# Patient Record
Sex: Male | Born: 1975 | Hispanic: Yes | Marital: Married | Smoking: Never smoker
Health system: Southern US, Community
[De-identification: ages and names within clinical notes are randomized; demographics above are authoritative.]

## PROBLEM LIST (undated history)

## (undated) DIAGNOSIS — Z9889 Other specified postprocedural states: Secondary | ICD-10-CM

## (undated) DIAGNOSIS — R55 Syncope and collapse: Secondary | ICD-10-CM

## (undated) DIAGNOSIS — E119 Type 2 diabetes mellitus without complications: Secondary | ICD-10-CM

## (undated) DIAGNOSIS — R112 Nausea with vomiting, unspecified: Secondary | ICD-10-CM

## (undated) DIAGNOSIS — T4145XA Adverse effect of unspecified anesthetic, initial encounter: Secondary | ICD-10-CM

## (undated) DIAGNOSIS — T8859XA Other complications of anesthesia, initial encounter: Secondary | ICD-10-CM

## (undated) HISTORY — PX: OTHER SURGICAL HISTORY: SHX169

---

## 2013-03-21 ENCOUNTER — Ambulatory Visit: Payer: No Typology Code available for payment source | Attending: Family Medicine | Admitting: Family Medicine

## 2013-03-21 ENCOUNTER — Telehealth: Payer: Self-pay

## 2013-03-21 VITALS — BP 118/69 | HR 53 | Temp 98.0°F | Resp 17 | Wt 226.0 lb

## 2013-03-21 DIAGNOSIS — L659 Nonscarring hair loss, unspecified: Secondary | ICD-10-CM | POA: Insufficient documentation

## 2013-03-21 DIAGNOSIS — L738 Other specified follicular disorders: Secondary | ICD-10-CM

## 2013-03-21 DIAGNOSIS — K029 Dental caries, unspecified: Secondary | ICD-10-CM

## 2013-03-21 DIAGNOSIS — Z0000 Encounter for general adult medical examination without abnormal findings: Secondary | ICD-10-CM | POA: Insufficient documentation

## 2013-03-21 DIAGNOSIS — L739 Follicular disorder, unspecified: Secondary | ICD-10-CM

## 2013-03-21 LAB — COMPLETE METABOLIC PANEL WITH GFR
Alkaline Phosphatase: 101 U/L (ref 39–117)
BUN: 21 mg/dL (ref 6–23)
GFR, Est Non African American: >89 mL/min
Glucose, Bld: 151 mg/dL — ABNORMAL HIGH (ref 70–99)
Sodium: 142 mEq/L (ref 135–145)
Total Bilirubin: 1 mg/dL (ref 0.3–1.2)
Total Protein: 7.2 g/dL (ref 6.0–8.3)

## 2013-03-21 LAB — CBC
Hemoglobin: 14.8 g/dL (ref 13.0–17.0)
MCH: 32 pg (ref 26.0–34.0)
MCHC: 34.4 g/dL (ref 30.0–36.0)

## 2013-03-21 LAB — LIPID PANEL
Cholesterol: 185 mg/dL (ref 0–200)
HDL: 45 mg/dL (ref >39)
Triglycerides: 233 mg/dL — ABNORMAL HIGH (ref <150)

## 2013-03-21 NOTE — Progress Notes (Signed)
Patient ID: Derek Guzman, male   DOB: April 09, 1976, 37 y.o.   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  He reports that he essentially has been healthy.  He's not currently taking any medications.  He reports that he does not smoke cigarettes or use recreational drugs.  He does drink alcohol on the weekends but does so in moderation.  The patient reports that he has otherwise been very well. No complaints at this time.  No Known Allergies History reviewed. No pertinent past medical history. No current outpatient prescriptions on file prior to visit.   No current facility-administered medications on file prior to visit.   History reviewed. No pertinent family history. History   Social History  . Marital Status: Married    Spouse Name: N/A    Number of Children: 2  . Years of Education: 11   Occupational History  . concrete     Social History Main Topics  . Smoking status: Never Smoker   . Smokeless tobacco: Not on file  . Alcohol Use: Yes  . Drug Use: No  . Sexually Active: Not on file   Other Topics Concern  . Not on file   Social History Narrative  . No narrative on file    Review of Systems  Constitutional: Negative for fever, chills, diaphoresis, activity change, appetite change and fatigue.  HENT: Negative for ear pain, nosebleeds, congestion, facial swelling, rhinorrhea, neck pain, neck stiffness and ear discharge.   Eyes: Negative for pain, discharge, redness, itching and visual disturbance.  Respiratory: Negative for cough, choking, chest tightness, shortness of breath, wheezing and stridor.   Cardiovascular: Negative for chest pain, palpitations and leg swelling.  Gastrointestinal: Negative for abdominal distention.  Genitourinary: Negative for dysuria, urgency, frequency,  hematuria, flank pain, decreased urine volume, difficulty urinating and dyspareunia.  Musculoskeletal: Negative for back pain, joint swelling, arthralgias and gait problem.  Neurological: Negative for dizziness, tremors, seizures, syncope, facial asymmetry, speech difficulty, weakness, light-headedness, numbness and headaches.  Hematological: Negative for adenopathy. Does not bruise/bleed easily.  Psychiatric/Behavioral: Negative for hallucinations, behavioral problems, confusion, dysphoric mood, decreased concentration and agitation.    Objective:   Filed Vitals:   03/21/13 1046  BP: 118/69  Pulse: 53  Temp: 98 F (36.7 C)  Resp: 17    Physical Exam  Constitutional: Appears well-developed and well-nourished. No distress.  HENT: Normocephalic. External right and left ear normal. Oropharynx is clear and moist. Dental caries seen. Folliculitis seen on back of scalp area with some alopecia seen.   Eyes: Conjunctivae and EOM are normal. PERRLA, no scleral icterus.  Neck: Normal ROM. Neck supple. No JVD. No tracheal deviation. No thyromegaly.  CVS: RRR, S1/S2 +, no murmurs, no gallops, no carotid bruit.  Pulmonary: Effort and breath sounds normal, no stridor, rhonchi, wheezes, rales.  Abdominal: Soft. BS +,  no distension, tenderness, rebound or guarding.  GU: No abnormality seen uncircumcised male, prostate exam within normal limits no masses palpated and no enlargement noted. Musculoskeletal: Normal range of motion. No edema and no tenderness.  Lymphadenopathy: No lymphadenopathy noted, cervical, inguinal. Neuro: Alert. Normal reflexes, muscle tone coordination. No cranial nerve deficit. Skin: Skin is warm and dry. No rash noted. Not diaphoretic. No erythema. No pallor.  Psychiatric: Normal mood and affect. Behavior, judgment, thought content normal.   No results found  for this basename: WBC, HGB, HCT, MCV, PLT   No results found for this basename: CREATININE, BUN, NA, K, CL, CO2     No results found for this basename: HGBA1C   Lipid Panel  No results found for this basename: chol, trig, hdl, cholhdl, vldl, ldlcalc    Assessment and plan:   Patient Active Problem List   Diagnosis Date Noted  . Routine general medical examination at a health care facility 03/21/2013  . Folliculitis 03/21/2013  . Dental caries 03/21/2013  . Health maintenance examination 03/21/2013   Healthy 37 year old male.  Will order routine labs today including metabolic panel and CBC with lipid panel.  Will refer patient to a dentist for further evaluation and treatment. Derm referral for treatment of folliculitis condition on back of scalp with alopecia.    Follow up lab results.    RTC in 6 months.   Rodney Langton, MD, CDE, FAAFP [facility] [place], [place]

## 2013-03-21 NOTE — Progress Notes (Signed)
Used the interpreter line Patient here for physical Takes no medications and has no allergies

## 2013-03-21 NOTE — Telephone Encounter (Signed)
Tried to call patient to inquire about Tdap vaccine(if he ever got one) Patient not available Left message to return our call

## 2013-03-21 NOTE — Patient Instructions (Addendum)
Foliculitis  (Folliculitis)  La foliculitis es el enrojecimiento, dolor e hinchazn (inflamacin) de los folculos pilosos. Puede ocurrir en cualquier parte del cuerpo. Las personas con un sistema inmunolgico debilitado, con diabetes u obesidad tienen mayor riesgo de sufrir foliculitis.  CAUSAS   Infecciones bacterianas. sta es la causa ms frecuente.  Infecciones por hongos.  Infecciones virales.  Contacto con ciertas sustancias qumicas, especialmente aceites y alquitrn. La foliculitis crnica puede ser el resultado de las bacterias que viven en las fosas nasales. Las bacterias pueden favorecer los brotes de foliculitis con el [place].  SNTOMAS  La foliculitis ocurre con mayor frecuencia en el cuero cabelludo, los muslos, las piernas, la [place], las nalgas y las reas donde el pelo se afeita con frecuencia. Una primera seal de foliculitis es una , lesin pequea llena de pus, de color blanco o amarillo, y que pica (pstula).Mickeal Skinner lesiones aparecen en un folculo inflamado y rojo. Por lo general miden menos de 0.2 pulgadas (5 mm) de ancho. Cuando hay una infeccin del folculo que se hace ms profundo, se convierte en un fornculo. Un grupo de varios fornculos juntos forma una lesin mayor (carbunclo). El carbunclo ocurre en reas pilosas y sudorosas del cuerpo.  DIAGNSTICO  El mdico har el diagnstico con un examen fsico. Podr tomarle Lauris Poag de una de las lesiones y Financial risk analyst en un labloratorio. As se determinar la causa de la foliculitis.  TRATAMIENTO  El tratamiento podr incluir:   La aplicacin de compresas calientes en la zona afectada.  Tomar antibiticos por va oral o aplicarlos sobre la piel.  Drenaje de las lesiones si contienen una gran cantidad de pus o lquido.  Depilacin lser para casos de foliculitis de larga duracin. Esto ayuda a Armed forces training and education officer del pelo. INSTRUCCIONES PARA EL CUIDADO EN EL HOGAR   Aplique compresas calientes en la  zona afectada segn lo indique su mdico.  Si le han recetado medicamentos, tmelos segn las indicaciones. Tmelos todos, aunque se sienta mejor.  Tome medicamentos de venta libre para Associate Professor.  No rasure la piel irritada.  Concurra a las visitas de control con el mdico, segn las indicaciones. SOLICITE ATENCIN MDICA DE INMEDIATO SI:   Observa un aumento del enrojecimiento, hinchazn o dolor en la zona afectada.  Tiene fiebre. ASEGRESE DE QUE:   Comprende estas instrucciones.  Controlar su enfermedad.  Solicitar ayuda de inmediato si no mejora o si empeora. Document Released: 08/29/2005 Document Revised: 02/28/2012 [facility] Patient Information 2014 [place], [place]. Caries Dental (Dental Caries) Las caries dentales son zonas de destruccin del diente. La causa generalmente es una combinacin de higiene dental deficiente. El azcar, el tabaco y el consumo de drogas disminuyen la produccin de saliva y retraen las encas. Si las caries no son tratadas por un dentista, pronto aumentan su tamao. Esto causa dolor, infecciones y prdida del diente. Las caries en el esmalte externo no causan sntomas. El dolor que se siente al beber lquidos fros puede ser Financial risk analyst sntoma de que el esmalte se ha roto y la caries ha avanzado hacia la raz del diente. Esto puede hacer que el diente se pierda o se infecte. Si la caries se trata antes de que cause dolor, el diente generalmente se salva. Las caries pueden prevenirse con una buena higiene dental. El lavado matutino y nocturno y el uso de hilo dental una vez por da ayuda a eliminar la placa y a reducir las bacterias. Los caramelos, las gaseosas y otras fuentes de International aid/development worker  facilitan las caries dentales favoreciendo el desarrollo de bacterias en la boca. Una dieta adecuada, la ingesta de fluor, la higiene dental y el relleno de la cavidad son medidas importantes para prevenir la prdida del diente. Si la caries es de [place], ser  necesaria la administracin de antibiticos, un tratamiento de conducto o Programmer, systems. Tome los antibiticos prescriptos y todos los medicamentos segn se lo indique el mdico. Es importante que concurra a las visitas de control para una curacin definitiva. SOLICITE ATENCIN MDICA SI:  Usted o su nio tienen una temperatura oral de ms de 102 F (38.9 C).  Tiene dificultad para abrir Government social research officer.  Tiene dificultad para tragar o eliminar secreciones.  Tiene problemas para respirar.  Siente dolor en el pecho.  Tiene sntomas que empeoran o lo preocupan. Document Released: 08/29/2005 Document Revised: 11/21/2011 [facility] Patient Information 2014 [place], [place]. Cuidado de los dientes y visitas al Emergency planning/management officer  (Dental Care and Dentist Visits) El cuidado dental favorece la buena salud en general. Las visitas regulares al odontlogo evitarn el dolor dental, sangrado, infecciones y otros problemas de salud ms graves en el futuro. Es Primary school teacher la boca sana, porque las enfermedades de los dientes, las encas y otros tejidos de la boca pueden extenderse a otras reas del cuerpo. Algunas enfermedades, como la diabetes, enfermedades cardacas y Government social research officer se han relacionado con una mala salud oral.  Visite a su odontlogo cada seis meses. Si tiene Radio broadcast assistant, como dolor de [place] o rotura de dientes consulte inmediatamente. Si concurre regularmente al [employer], podr [employer] antes de [place]. Es ms fcil realizar un tratamiento en las primeras etapas.  QU ESPERAR EN UNA VISITA AL DENTISTA  El odontlogo har un diagnstico de los problemas de salud de la boca e indicar el tratamiento [place]. En su visita habitual al odontlogo podr recibir:   Neomia Dear limpieza suave de los dientes y las encas. Incluye raspado y pulido. Esta prctica eliminar la sustancia pegajosa que cubre los dientes y las encas (placa). La placa se forma en la boca despus de comer. Con  el tiempo, la placa se endurece en los dientes y se forma sarro. Si el sarro no se elimina regularmente, puede causar problemas. La limpieza tambin ayuda a [employer].  Radiografas peridicas. Estas imgenes de los dientes y del [employer] sostiene le permitirn al dentista evaluar la salud de sus dientes.  Tratamientos peridicos con flor. El flor es un mineral natural que se ha comprobado que fortalece los dientes. El tratamiento con flor consiste en la aplicacin de un gel o barniz de flor [employer]. Se realiza con ms frecuencia en los nios.  Examen de la boca, [place], [place], los dientes y las encas para ver si hay problemas de salud bucal como:  Caries (caries dentales). Es la erosin del diente debido a la placa, Insurance claims handler y el cido en la boca. Lo mejor es tratar la caries cuando es pequea.  Inflamacin de las encas causada por la acumulacin de placa (gingivitis).  Problemas con la boca o dientes mal formados o mal alineados.  Cncer bucal u otras enfermedades de los tejidos blandos o de las [place].  MANTENGA LA SALUD DE SUS DIENTES Y ENCAS  Para tener dientes y encas sanos siga estas pautas generales, as como asesoramiento especfico de su dentista:   Hgase una limpieza dental con el dentista cada seis meses.  Cepllese dos veces al da con una crema dental con  flor.  Pase hilo dental todos los das.  Pregntele al dentista si necesita suplementos, tratamientos o dentfrico con flor.  Consuma una dieta saludable. Reduzca el consumo de alimentos y bebidas con azcar agregada.  Evite fumar. TRATAMIENTO PARA LOS PROBLEMAS DE SALUD ORALES  Si tiene problemas de [employer] boca, el tratamiento variar segn el estado actual de los dientes y las encas.   Su mdico le recomendar una buena higiene oral en cada visita.  Para las caries, la gingivitis u otras enfermedades de la boca, el mdico llevar a cabo un procedimiento para  tratar el problema. Esto se realiza generalmente en otra visita programada. En algunos casos lo derivar a Probation officer para tratar problemas especficos de los dientes o para realizar [place]. SOLICITE ATENCIN ODONTOLGICA DE INMEDIATO SI:   Siente dolor, tiene sangrado o sensibilidad en las encas, los dientes, la mandbula o la zona de la boca.  Un diente permanente se afloja o se separa de la cavidad de la enca.  Ha sufrido un golpe o una lesin en la boca o en la zona de la [place]. Document Released: 05/23/2012 [facility] Patient Information 2014 [place], [place].

## 2013-03-22 ENCOUNTER — Telehealth: Payer: Self-pay

## 2013-03-22 LAB — VITAMIN D 25 HYDROXY (VIT D DEFICIENCY, FRACTURES): Vit D, 25-Hydroxy: 26 ng/mL — ABNORMAL LOW (ref 30–89)

## 2013-03-22 NOTE — Telephone Encounter (Signed)
Message copied by Lestine Mount on Fri Mar 22, 2013 10:57 AM ------      Message from: Cleora Fleet      Created: Fri Mar 22, 2013  8:55 AM       Interpreter needed to communicate with patient: Please inform patient that his labs came back okay except his blood sugar was elevated.  I would like for him to come back in next week to get an A1c test done in the office.  This is to make sure he does not have diabetes or prediabetes.  His vitamin D level came back a little low.  Recommend he take over-the-counter vitamin D 1000 international units caps take 1 by mouth daily.  His other labs came back okay.                    Rodney Langton, MD, CDE, FAAFP      [facility]      [facility]      [place], [place]        ------

## 2013-03-22 NOTE — Progress Notes (Signed)
Quick Note:  Interpreter needed to communicate with patient: Please inform patient that his labs came back okay except his blood sugar was elevated. I would like for him to come back in next week to get an A1c test done in the office. This is to make sure he does not have diabetes or prediabetes. His vitamin D level came back a little low. Recommend he take over-the-counter vitamin D 1000 international units caps take 1 by mouth daily. His other labs came back okay.    Derek Langton, MD, CDE, FAAFP [facility] [place], [place]   ______

## 2013-03-22 NOTE — Telephone Encounter (Signed)
Left message on machine to return our call. 

## 2013-03-27 ENCOUNTER — Telehealth: Payer: Self-pay | Admitting: Family Medicine

## 2013-03-27 NOTE — Telephone Encounter (Signed)
Clinical: Pt's wife calling for bloodwork results, pt is at work.  Pt and wife speak spanish.   Arna Medici: wife calling about dermatology and dental referral.

## 2013-03-28 NOTE — Telephone Encounter (Signed)
I been calling the patient number and is a  wrong number. i mailed a letter to pt to contact me on 03/21/13 .

## 2013-04-04 ENCOUNTER — Telehealth: Payer: Self-pay | Admitting: *Deleted

## 2013-04-04 NOTE — Telephone Encounter (Signed)
04/04/13 Spoke with patient and made aware of lab doctor recommend to start taking Vitamin D 1000 iu over the counter Also appointment made for 04/11/13 f/u with Diabetes and A1c. Sherlene Shams BSN MHA

## 2013-04-11 ENCOUNTER — Ambulatory Visit: Payer: No Typology Code available for payment source | Admitting: Family Medicine

## 2014-01-10 ENCOUNTER — Ambulatory Visit: Payer: No Typology Code available for payment source | Attending: Internal Medicine | Admitting: Family Medicine

## 2014-01-10 ENCOUNTER — Encounter: Payer: Self-pay | Admitting: Family Medicine

## 2014-01-10 VITALS — BP 130/77 | HR 63 | Temp 98.5°F | Resp 17 | Wt 226.8 lb

## 2014-01-10 DIAGNOSIS — R7989 Other specified abnormal findings of blood chemistry: Secondary | ICD-10-CM

## 2014-01-10 DIAGNOSIS — L989 Disorder of the skin and subcutaneous tissue, unspecified: Secondary | ICD-10-CM | POA: Insufficient documentation

## 2014-01-10 DIAGNOSIS — R011 Cardiac murmur, unspecified: Secondary | ICD-10-CM | POA: Insufficient documentation

## 2014-01-10 DIAGNOSIS — Z0000 Encounter for general adult medical examination without abnormal findings: Secondary | ICD-10-CM

## 2014-01-10 DIAGNOSIS — B369 Superficial mycosis, unspecified: Secondary | ICD-10-CM | POA: Insufficient documentation

## 2014-01-10 LAB — GLUCOSE, POCT (MANUAL RESULT ENTRY): POC GLUCOSE: 148 mg/dL — AB (ref 70–99)

## 2014-01-10 LAB — POCT GLYCOSYLATED HEMOGLOBIN (HGB A1C): HEMOGLOBIN A1C: 5.8

## 2014-01-10 MED ORDER — ERYTHROMYCIN 2 % EX OINT: TOPICAL_OINTMENT | CUTANEOUS | Status: DC

## 2014-01-10 MED ORDER — PENICILLIN V POTASSIUM 500 MG PO TABS: 500.0000 mg | ORAL_TABLET | Freq: Four times a day (QID) | ORAL | Status: DC

## 2014-01-10 NOTE — Patient Instructions (Addendum)
Use ointment for neck twice a day It will take about two months to take effect Follow a low carb low fat diet Return in two years for physical

## 2014-01-10 NOTE — Progress Notes (Signed)
   Subjective:    Patient ID: Derek Guzman, male    DOB: 06/13/1976, 38 y.o.   MRN: [id number]  HPI patient is here with interpreter States he is here for a physical  Takes no prescribed medications Has a nephew that has a heart murmur  Has no extensive background history of illness in his family Generally a healthy individual  History of elevated blood sugar 150 on recent labs  Patient reports no  vision/ hearing changes,anorexia, weight change, fever ,adenopathy, persistant / recurrent hoarseness, swallowing issues, chest pain, edema,persistant / recurrent cough, hemoptysis, dyspnea(rest, exertional, paroxysmal nocturnal), gastrointestinal  bleeding (melena, rectal bleeding), abdominal pain, excessive heart burn, GU symptoms(dysuria, hematuria, pyuria, voiding/incontinence  Issues) syncope, focal weakness, severe memory loss, concerning skin lesions, depression, anxiety, abnormal bruising/bleeding, major joint swelling.     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  Otoscopic examination reveals clear canals, tympanic membranes are intact bilaterally without bulging, retraction, inflammation or discharge. Hearing is grossly normal bilaterall Eye - Pupils Equal Round Reactive to light, Extraocular movements intact, Fundi without hemorrhage or visible lesions, Conjunctiva without redness or discharge Lungs:  Normal respiratory effort, chest expands symmetrically. Lungs are clear to auscultation, no crackles or wheezes. Heart - Regular rate and rhythm.  No murmurs, gallops or rubs.    No visible rash Abdomen: soft and non-tender without masses, organomegaly or hernias noted.  No guarding or rebound Extremities:  No cyanosis, edema, or deformity noted with good range of motion of all major joints.           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

## 2014-03-18 ENCOUNTER — Emergency Department (HOSPITAL_COMMUNITY): Payer: Worker's Compensation

## 2014-03-18 ENCOUNTER — Inpatient Hospital Stay (HOSPITAL_COMMUNITY)
Admission: EM | Admit: 2014-03-18 | Discharge: 2014-03-22 | DRG: 464 | Disposition: A | Discharge status: Home Health | Payer: Worker's Compensation | Attending: Orthopedic Surgery | Admitting: Orthopedic Surgery

## 2014-03-18 ENCOUNTER — Encounter (HOSPITAL_COMMUNITY): Payer: Self-pay | Admitting: Emergency Medicine

## 2014-03-18 ENCOUNTER — Encounter (HOSPITAL_COMMUNITY)
Admission: EM | Disposition: A | Discharge status: Home Health | Payer: Self-pay | Source: Home / Self Care | Attending: Orthopedic Surgery

## 2014-03-18 ENCOUNTER — Encounter (HOSPITAL_COMMUNITY): Payer: Worker's Compensation | Admitting: Critical Care Medicine

## 2014-03-18 ENCOUNTER — Emergency Department (HOSPITAL_COMMUNITY): Payer: Worker's Compensation | Admitting: Critical Care Medicine

## 2014-03-18 DIAGNOSIS — W3183XA Contact with special construction vehicle in stationary use, initial encounter: Secondary | ICD-10-CM

## 2014-03-18 DIAGNOSIS — S82843A Displaced bimalleolar fracture of unspecified lower leg, initial encounter for closed fracture: Principal | ICD-10-CM | POA: Diagnosis present

## 2014-03-18 DIAGNOSIS — S82409A Unspecified fracture of shaft of unspecified fibula, initial encounter for closed fracture: Secondary | ICD-10-CM | POA: Diagnosis present

## 2014-03-18 DIAGNOSIS — S9700XA Crushing injury of unspecified ankle, initial encounter: Secondary | ICD-10-CM | POA: Diagnosis present

## 2014-03-18 DIAGNOSIS — Z6832 Body mass index (BMI) 32.0-32.9, adult: Secondary | ICD-10-CM

## 2014-03-18 DIAGNOSIS — T79A29A Traumatic compartment syndrome of unspecified lower extremity, initial encounter: Secondary | ICD-10-CM | POA: Diagnosis present

## 2014-03-18 DIAGNOSIS — T79A21A Traumatic compartment syndrome of right lower extremity, initial encounter: Secondary | ICD-10-CM

## 2014-03-18 DIAGNOSIS — S82891A Other fracture of right lower leg, initial encounter for closed fracture: Secondary | ICD-10-CM

## 2014-03-18 HISTORY — PX: FASCIOTOMY: SHX132

## 2014-03-18 HISTORY — PX: ORIF ANKLE FRACTURE: SHX5408

## 2014-03-18 HISTORY — PX: APPLICATION OF WOUND VAC: SHX5189

## 2014-03-18 LAB — ABO/RH: ABO/RH(D): O POS

## 2014-03-18 LAB — TYPE AND SCREEN
ABO/RH(D): O POS
ANTIBODY SCREEN: NEGATIVE

## 2014-03-18 LAB — CBC
HCT: 41.8 % (ref 39.0–52.0)
HEMOGLOBIN: 14.5 g/dL (ref 13.0–17.0)
MCH: 33.3 pg (ref 26.0–34.0)
MCHC: 34.7 g/dL (ref 30.0–36.0)
MCV: 96.1 fL (ref 78.0–100.0)
Platelets: 180 10*3/uL (ref 150–400)
RBC: 4.35 MIL/uL (ref 4.22–5.81)
RDW: 13.1 % (ref 11.5–15.5)
WBC: 14 10*3/uL — ABNORMAL HIGH (ref 4.0–10.5)

## 2014-03-18 LAB — BASIC METABOLIC PANEL
Anion gap: 15 (ref 5–15)
BUN: 19 mg/dL (ref 6–23)
CO2: 24 mEq/L (ref 19–32)
Calcium: 9 mg/dL (ref 8.4–10.5)
Chloride: 105 mEq/L (ref 96–112)
Creatinine, Ser: 1 mg/dL (ref 0.50–1.35)
GFR calc Af Amer: >90 mL/min (ref >90)
GFR calc non Af Amer: >90 mL/min (ref >90)
GLUCOSE: 149 mg/dL — AB (ref 70–99)
POTASSIUM: 4 meq/L (ref 3.7–5.3)
Sodium: 144 mEq/L (ref 137–147)

## 2014-03-18 LAB — GLUCOSE, CAPILLARY: Glucose-Capillary: 190 mg/dL — ABNORMAL HIGH (ref 70–99)

## 2014-03-18 SURGERY — OPEN REDUCTION INTERNAL FIXATION (ORIF) ANKLE FRACTURE
Anesthesia: General | Site: Leg Lower | Laterality: Right

## 2014-03-18 MED ORDER — HYDROMORPHONE HCL PF 1 MG/ML IJ SOLN
0.5000 mg | INTRAMUSCULAR | Status: DC | PRN
  Administered 2014-03-18 – 2014-03-21 (×6): 1 mg via INTRAVENOUS
  Filled 2014-03-18 (×6): qty 1

## 2014-03-18 MED ORDER — 0.9 % SODIUM CHLORIDE (POUR BTL) OPTIME
TOPICAL | Status: DC | PRN
  Administered 2014-03-18 (×2): 1000 mL

## 2014-03-18 MED ORDER — LIDOCAINE HCL (CARDIAC) 20 MG/ML IV SOLN
INTRAVENOUS | Status: DC | PRN
  Administered 2014-03-18: 70 mg via INTRAVENOUS

## 2014-03-18 MED ORDER — SENNOSIDES-DOCUSATE SODIUM 8.6-50 MG PO TABS
1.0000 | ORAL_TABLET | Freq: Every evening | ORAL | Status: DC | PRN
  Administered 2014-03-19: 1 via ORAL
  Filled 2014-03-18: qty 1

## 2014-03-18 MED ORDER — CEFAZOLIN SODIUM 1-5 GM-% IV SOLN
1.0000 g | Freq: Four times a day (QID) | INTRAVENOUS | Status: AC
  Administered 2014-03-18 – 2014-03-19 (×3): 1 g via INTRAVENOUS
  Filled 2014-03-18 (×3): qty 50

## 2014-03-18 MED ORDER — ONDANSETRON HCL 4 MG/2ML IJ SOLN
4.0000 mg | Freq: Four times a day (QID) | INTRAMUSCULAR | Status: DC | PRN
  Administered 2014-03-18 – 2014-03-19 (×2): 4 mg via INTRAVENOUS
  Filled 2014-03-18 (×2): qty 2

## 2014-03-18 MED ORDER — DEXAMETHASONE SODIUM PHOSPHATE 4 MG/ML IJ SOLN
INTRAMUSCULAR | Status: AC
  Filled 2014-03-18: qty 1

## 2014-03-18 MED ORDER — ONDANSETRON HCL 4 MG PO TABS: 4.0000 mg | ORAL_TABLET | Freq: Four times a day (QID) | ORAL | Status: DC | PRN

## 2014-03-18 MED ORDER — MIDAZOLAM HCL 5 MG/5ML IJ SOLN
INTRAMUSCULAR | Status: DC | PRN
  Administered 2014-03-18: 2 mg via INTRAVENOUS

## 2014-03-18 MED ORDER — OXYCODONE-ACETAMINOPHEN 5-325 MG PO TABS
1.0000 | ORAL_TABLET | ORAL | Status: DC | PRN
  Administered 2014-03-19 – 2014-03-22 (×11): 2 via ORAL
  Filled 2014-03-18 (×11): qty 2

## 2014-03-18 MED ORDER — HYDROMORPHONE HCL PF 1 MG/ML IJ SOLN
INTRAMUSCULAR | Status: AC
  Filled 2014-03-18: qty 1

## 2014-03-18 MED ORDER — HYDROMORPHONE HCL PF 1 MG/ML IJ SOLN
1.0000 mg | Freq: Once | INTRAMUSCULAR | Status: AC
  Administered 2014-03-18: 1 mg via INTRAVENOUS

## 2014-03-18 MED ORDER — PHENYLEPHRINE 40 MCG/ML (10ML) SYRINGE FOR IV PUSH (FOR BLOOD PRESSURE SUPPORT)
PREFILLED_SYRINGE | INTRAVENOUS | Status: AC
  Filled 2014-03-18: qty 10

## 2014-03-18 MED ORDER — EPHEDRINE SULFATE 50 MG/ML IJ SOLN
INTRAMUSCULAR | Status: DC | PRN
  Administered 2014-03-18: 20 mg via INTRAVENOUS

## 2014-03-18 MED ORDER — BISACODYL 5 MG PO TBEC: 5.0000 mg | DELAYED_RELEASE_TABLET | Freq: Every day | ORAL | Status: DC | PRN

## 2014-03-18 MED ORDER — FENTANYL CITRATE 0.05 MG/ML IJ SOLN
INTRAMUSCULAR | Status: DC | PRN
Start: 2014-03-18 — End: 2014-03-18
  Administered 2014-03-18: 25 ug via INTRAVENOUS
  Administered 2014-03-18: 100 ug via INTRAVENOUS
  Administered 2014-03-18 (×3): 25 ug via INTRAVENOUS

## 2014-03-18 MED ORDER — ROCURONIUM BROMIDE 50 MG/5ML IV SOLN
INTRAVENOUS | Status: AC
  Filled 2014-03-18: qty 1

## 2014-03-18 MED ORDER — LIDOCAINE HCL (CARDIAC) 20 MG/ML IV SOLN
INTRAVENOUS | Status: AC
  Filled 2014-03-18: qty 5

## 2014-03-18 MED ORDER — PROPOFOL 10 MG/ML IV BOLUS
INTRAVENOUS | Status: AC
  Filled 2014-03-18: qty 20

## 2014-03-18 MED ORDER — ONDANSETRON HCL 4 MG/2ML IJ SOLN
INTRAMUSCULAR | Status: DC | PRN
  Administered 2014-03-18: 4 mg via INTRAVENOUS

## 2014-03-18 MED ORDER — PHENYLEPHRINE HCL 10 MG/ML IJ SOLN
INTRAMUSCULAR | Status: DC | PRN
  Administered 2014-03-18: 120 ug via INTRAVENOUS
  Administered 2014-03-18: 200 ug via INTRAVENOUS

## 2014-03-18 MED ORDER — SUCCINYLCHOLINE CHLORIDE 20 MG/ML IJ SOLN
INTRAMUSCULAR | Status: DC | PRN
  Administered 2014-03-18: 120 mg via INTRAVENOUS

## 2014-03-18 MED ORDER — HYDROMORPHONE HCL PF 1 MG/ML IJ SOLN
INTRAMUSCULAR | Status: AC
  Administered 2014-03-18: 1 mg via INTRAVENOUS
  Filled 2014-03-18: qty 1

## 2014-03-18 MED ORDER — ONDANSETRON HCL 4 MG/2ML IJ SOLN
INTRAMUSCULAR | Status: AC
  Filled 2014-03-18: qty 2

## 2014-03-18 MED ORDER — FENTANYL CITRATE 0.05 MG/ML IJ SOLN
INTRAMUSCULAR | Status: AC
  Filled 2014-03-18: qty 5

## 2014-03-18 MED ORDER — DOCUSATE SODIUM 100 MG PO CAPS
100.0000 mg | ORAL_CAPSULE | Freq: Two times a day (BID) | ORAL | Status: DC
  Administered 2014-03-18 – 2014-03-22 (×8): 100 mg via ORAL
  Filled 2014-03-18 (×7): qty 1

## 2014-03-18 MED ORDER — MIDAZOLAM HCL 2 MG/2ML IJ SOLN
INTRAMUSCULAR | Status: AC
  Filled 2014-03-18: qty 2

## 2014-03-18 MED ORDER — METHOCARBAMOL 1000 MG/10ML IJ SOLN
500.0000 mg | Freq: Four times a day (QID) | INTRAVENOUS | Status: DC | PRN
  Filled 2014-03-18: qty 5

## 2014-03-18 MED ORDER — PROPOFOL 10 MG/ML IV BOLUS
INTRAVENOUS | Status: DC | PRN
  Administered 2014-03-18: 200 mg via INTRAVENOUS

## 2014-03-18 MED ORDER — METOCLOPRAMIDE HCL 5 MG/ML IJ SOLN
5.0000 mg | Freq: Three times a day (TID) | INTRAMUSCULAR | Status: DC | PRN
  Administered 2014-03-19: 10 mg via INTRAVENOUS
  Filled 2014-03-18: qty 2

## 2014-03-18 MED ORDER — METOCLOPRAMIDE HCL 5 MG PO TABS: 5.0000 mg | ORAL_TABLET | Freq: Three times a day (TID) | ORAL | Status: DC | PRN

## 2014-03-18 MED ORDER — MAGNESIUM CITRATE PO SOLN
1.0000 | Freq: Once | ORAL | Status: AC | PRN
  Filled 2014-03-18: qty 296

## 2014-03-18 MED ORDER — DEXAMETHASONE SODIUM PHOSPHATE 4 MG/ML IJ SOLN
INTRAMUSCULAR | Status: DC | PRN
Start: 2014-03-18 — End: 2014-03-18
  Administered 2014-03-18: 4 mg via INTRAVENOUS

## 2014-03-18 MED ORDER — IOHEXOL 300 MG/ML  SOLN
80.0000 mL | Freq: Once | INTRAMUSCULAR | Status: AC | PRN
  Administered 2014-03-18: 80 mL via INTRAVENOUS

## 2014-03-18 MED ORDER — LACTATED RINGERS IV SOLN
INTRAVENOUS | Status: DC | PRN
  Administered 2014-03-18 (×3): via INTRAVENOUS

## 2014-03-18 MED ORDER — SODIUM CHLORIDE 0.9 % IV SOLN
INTRAVENOUS | Status: DC
  Administered 2014-03-18: 22:00:00 via INTRAVENOUS

## 2014-03-18 MED ORDER — HYDROMORPHONE HCL PF 1 MG/ML IJ SOLN
0.2500 mg | INTRAMUSCULAR | Status: DC | PRN
  Administered 2014-03-18 (×2): 0.5 mg via INTRAVENOUS

## 2014-03-18 MED ORDER — CEFAZOLIN SODIUM-DEXTROSE 2-3 GM-% IV SOLR
INTRAVENOUS | Status: DC | PRN
  Administered 2014-03-18: 2 g via INTRAVENOUS

## 2014-03-18 MED ORDER — METHOCARBAMOL 500 MG PO TABS
500.0000 mg | ORAL_TABLET | Freq: Four times a day (QID) | ORAL | Status: DC | PRN
  Administered 2014-03-18 – 2014-03-21 (×5): 500 mg via ORAL
  Filled 2014-03-18 (×5): qty 1

## 2014-03-18 SURGICAL SUPPLY — 63 items
BANDAGE ELASTIC 4 VELCRO ST LF (GAUZE/BANDAGES/DRESSINGS) ×4 IMPLANT
BANDAGE ELASTIC 6 VELCRO ST LF (GAUZE/BANDAGES/DRESSINGS) ×4 IMPLANT
BANDAGE ESMARK 6X9 LF (GAUZE/BANDAGES/DRESSINGS) IMPLANT
BANDAGE GAUZE ELAST BULKY 4 IN (GAUZE/BANDAGES/DRESSINGS) ×4 IMPLANT
BNDG COHESIVE 4X5 TAN STRL (GAUZE/BANDAGES/DRESSINGS) ×4 IMPLANT
BNDG ESMARK 6X9 LF (GAUZE/BANDAGES/DRESSINGS)
BNDG GAUZE STRTCH 6 (GAUZE/BANDAGES/DRESSINGS) ×4 IMPLANT
CANISTER WOUND CARE 500ML ATS (WOUND CARE) ×4 IMPLANT
COVER SURGICAL LIGHT HANDLE (MISCELLANEOUS) ×4 IMPLANT
CUFF TOURNIQUET SINGLE 24IN (TOURNIQUET CUFF) IMPLANT
CUFF TOURNIQUET SINGLE 34IN LL (TOURNIQUET CUFF) IMPLANT
CUFF TOURNIQUET SINGLE 44IN (TOURNIQUET CUFF) IMPLANT
DRAPE C-ARM MINI 42X72 WSTRAPS (DRAPES) IMPLANT
DRAPE INCISE IOBAN 66X45 STRL (DRAPES) ×4 IMPLANT
DRAPE ORTHO SPLIT 77X108 STRL (DRAPES) ×4
DRAPE PROXIMA HALF (DRAPES) ×4 IMPLANT
DRAPE SURG ORHT 6 SPLT 77X108 (DRAPES) ×4 IMPLANT
DRAPE U-SHAPE 47X51 STRL (DRAPES) ×4 IMPLANT
DRSG ADAPTIC 3X8 NADH LF (GAUZE/BANDAGES/DRESSINGS) ×4 IMPLANT
DRSG PAD ABDOMINAL 8X10 ST (GAUZE/BANDAGES/DRESSINGS) ×4 IMPLANT
DRSG VAC ATS LRG SENSATRAC (GAUZE/BANDAGES/DRESSINGS) ×4 IMPLANT
DRSG VAC ATS MED SENSATRAC (GAUZE/BANDAGES/DRESSINGS) IMPLANT
DRSG VAC ATS SM SENSATRAC (GAUZE/BANDAGES/DRESSINGS) IMPLANT
DURAPREP 26ML APPLICATOR (WOUND CARE) ×4 IMPLANT
ELECT REM PT RETURN 9FT ADLT (ELECTROSURGICAL) ×4
ELECTRODE REM PT RTRN 9FT ADLT (ELECTROSURGICAL) ×2 IMPLANT
GLOVE BIOGEL PI IND STRL 9 (GLOVE) ×2 IMPLANT
GLOVE BIOGEL PI INDICATOR 9 (GLOVE) ×2
GLOVE SURG ORTHO 9.0 STRL STRW (GLOVE) ×4 IMPLANT
GOWN STRL REUS W/ TWL XL LVL3 (GOWN DISPOSABLE) ×6 IMPLANT
GOWN STRL REUS W/TWL XL LVL3 (GOWN DISPOSABLE) ×6
K-WIRE 1.25 TRCR POINT 150 (WIRE) ×8
KIT BASIN OR (CUSTOM PROCEDURE TRAY) ×4 IMPLANT
KIT ROOM TURNOVER OR (KITS) ×4 IMPLANT
KWIRE 1.25 TRCR POINT 150 (WIRE) ×4 IMPLANT
MANIFOLD NEPTUNE II (INSTRUMENTS) ×4 IMPLANT
NS IRRIG 1000ML POUR BTL (IV SOLUTION) ×4 IMPLANT
PACK GENERAL/GYN (CUSTOM PROCEDURE TRAY) ×4 IMPLANT
PACK ORTHO EXTREMITY (CUSTOM PROCEDURE TRAY) ×4 IMPLANT
PAD ARMBOARD 7.5X6 YLW CONV (MISCELLANEOUS) ×8 IMPLANT
PADDING CAST COTTON 6X4 STRL (CAST SUPPLIES) ×4 IMPLANT
PROS LCP PLATE 9H 124M (Plate) ×4 IMPLANT
PROSTHESIS LCP PLATE 9H 124M (Plate) ×2 IMPLANT
PUTTY DBM STAGRAFT PLUS 2CC (Putty) ×4 IMPLANT
SCREW CANN S THRD/44 4.0 (Screw) ×8 IMPLANT
SCREW CORTEX 2.7X12MM (Screw) ×16 IMPLANT
SCREW CORTEX 2.7X14MM (Screw) ×4 IMPLANT
SCREW CORTEX 2.7X16MM (Screw) ×4 IMPLANT
SET MONITOR QUICK PRESSURE (MISCELLANEOUS) IMPLANT
SPONGE GAUZE 4X4 12PLY (GAUZE/BANDAGES/DRESSINGS) ×4 IMPLANT
SPONGE LAP 18X18 X RAY DECT (DISPOSABLE) ×4 IMPLANT
STAPLER VISISTAT 35W (STAPLE) IMPLANT
STOCKINETTE IMPERVIOUS 9X36 MD (GAUZE/BANDAGES/DRESSINGS) ×4 IMPLANT
SUCTION FRAZIER TIP 10 FR DISP (SUCTIONS) ×4 IMPLANT
SUT ETHILON 2 0 FSLX (SUTURE) IMPLANT
SUT ETHILON 2 0 PSLX (SUTURE) IMPLANT
SUT VIC AB 0 CTB1 27 (SUTURE) IMPLANT
SUT VIC AB 2-0 CTB1 (SUTURE) ×8 IMPLANT
TOWEL OR 17X24 6PK STRL BLUE (TOWEL DISPOSABLE) ×4 IMPLANT
TOWEL OR 17X26 10 PK STRL BLUE (TOWEL DISPOSABLE) ×4 IMPLANT
TUBE CONNECTING 12'X1/4 (SUCTIONS) ×1
TUBE CONNECTING 12X1/4 (SUCTIONS) ×3 IMPLANT
WATER STERILE IRR 1000ML POUR (IV SOLUTION) ×4 IMPLANT
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
## 2014-03-18 NOTE — Progress Notes (Signed)
Pt arrived to the floor via stretcher from pacu at 1845. Pt A&O x4 but drowsy, speaks spanish with interpreter at side; pt  IV intact and infusing; wound vac dsg to leg intact and suctioning at 75 continues; neuro intact to RLE; pt oriented to unit; family at bedside; call light within reach and reported off to incoming RN. Arabella MerlesP. Amo Samara Stankowski RN.

## 2014-03-18 NOTE — Anesthesia Procedure Notes (Signed)
Procedure Name: Intubation Date/Time: 03/18/2014 2:49 PM Performed by: Elon AlasLEE, Navaeh Kehres BROWN Pre-anesthesia Checklist: Patient identified, Emergency Drugs available, Timeout performed, Suction available and Patient being monitored Patient Re-evaluated:Patient Re-evaluated prior to inductionOxygen Delivery Method: Circle system utilized Preoxygenation: Pre-oxygenation with 100% oxygen Intubation Type: IV induction, Cricoid Pressure applied and Rapid sequence Laryngoscope Size: Mac and 4 Grade View: Grade I Tube type: Oral Tube size: 7.5 mm Number of attempts: 1 Airway Equipment and Method: Stylet Placement Confirmation: CO2 detector,  positive ETCO2,  ETT inserted through vocal cords under direct vision and breath sounds checked- equal and bilateral Secured at: 24 cm Tube secured with: Tape Dental Injury: Teeth and Oropharynx as per pre-operative assessment

## 2014-03-18 NOTE — ED Notes (Signed)
Pt returned to room from radiology

## 2014-03-18 NOTE — Transfer of Care (Signed)
Immediate Anesthesia Transfer of Care Note  Patient: Derek Guzman  Procedure(s) Performed: Procedure(s): OPEN REDUCTION INTERNAL FIXATION (ORIF) ANKLE FRACTURE (Right) FASCIOTOMY (Right) APPLICATION OF WOUND VAC (Right)  Patient Location: PACU  Anesthesia Type:General  Level of Consciousness: awake, alert  and oriented  Airway & Oxygen Therapy: Patient Spontanous Breathing and Patient connected to nasal cannula oxygen  Post-op Assessment: Report given to PACU RN and Post -op Vital signs reviewed and stable  Post vital signs: Reviewed and stable  Complications: No apparent anesthesia complications

## 2014-03-18 NOTE — ED Notes (Signed)
Ortho MD at bedside.

## 2014-03-18 NOTE — Progress Notes (Signed)
Orthopedic Tech Progress Note Patient Details:  Derek NeerRaul Guzman 01/21/1976 [id number]  Ortho Devices Type of Ortho Device: Postop shoe/boot Ortho Device/Splint Location: post op shoe is applied to the right foot of patient.  Ortho Device/Splint Interventions: Application   Early CharsBaker,[place] Anthony 03/18/2014, 10:45 PM

## 2014-03-18 NOTE — Anesthesia Preprocedure Evaluation (Addendum)
Anesthesia Evaluation  Patient identified by MRN, date of birth, ID band Patient awake    Reviewed: Allergy & Precautions, H&P , NPO status , Patient's Chart, lab work & pertinent test results  Airway       Dental  (+) Dental Advisory Given   Pulmonary          Cardiovascular     Neuro/Psych    GI/Hepatic   Endo/Other  Morbid obesity  Renal/GU      Musculoskeletal   Abdominal   Peds  Hematology   Anesthesia Other Findings   Reproductive/Obstetrics                           Anesthesia Physical Anesthesia Plan  ASA: I and emergent  Anesthesia Plan: General   Post-op Pain Management:    Induction: Intravenous, Rapid sequence and Cricoid pressure planned  Airway Management Planned: Oral ETT  Additional Equipment:   Intra-op Plan:   Post-operative Plan: Extubation in OR  Informed Consent: I have reviewed the patients History and Physical, chart, labs and discussed the procedure including the risks, benefits and alternatives for the proposed anesthesia with the patient or authorized representative who has indicated his/her understanding and acceptance.   Dental advisory given  Plan Discussed with: Anesthesiologist and Surgeon  Anesthesia Plan Comments:         Anesthesia Quick Evaluation
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
## 2014-03-18 NOTE — Anesthesia Postprocedure Evaluation (Signed)
  Anesthesia Post-op Note  Patient: Derek Guzman  Procedure(s) Performed: Procedure(s): OPEN REDUCTION INTERNAL FIXATION (ORIF) ANKLE FRACTURE (Right) FASCIOTOMY (Right) APPLICATION OF WOUND VAC (Right)  Patient Location: PACU  Anesthesia Type:General  Level of Consciousness: awake  Airway and Oxygen Therapy: Patient Spontanous Breathing  Post-op Pain: mild  Post-op Assessment: Post-op Vital signs reviewed  Post-op Vital Signs: Reviewed  Last Vitals:  Filed Vitals:   03/18/14 1630  BP: 129/76  Pulse: 83  Temp: 37 C  Resp: 16    Complications: No apparent anesthesia complications
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
## 2014-03-19 ENCOUNTER — Encounter (HOSPITAL_COMMUNITY): Payer: Self-pay | Admitting: Orthopedic Surgery

## 2014-03-19 LAB — GLUCOSE, CAPILLARY: Glucose-Capillary: 128 mg/dL — ABNORMAL HIGH (ref 70–99)

## 2014-03-19 NOTE — Care Management Note (Signed)
Page 2 of 2   03/21/2014     1:31:48 PM CARE MANAGEMENT NOTE 03/21/2014  Patient:  Derek Guzman,Derek Guzman   Account Number:  [id number]  Date Initiated:  03/19/2014  Documentation initiated by:  Ronny FlurryWILE,Jayci Ellefson  Subjective/Objective Assessment:     Action/Plan:   Anticipated DC Date:     Anticipated DC Plan:           Choice offered to / List presented to:             Status of service:   Medicare Important Message given?   (If response is "NO", the following Medicare IM given date fields will be blank) Date Medicare IM given:   Medicare IM given by:   Date Additional Medicare IM given:   Additional Medicare IM given by:    Discharge Disposition:    Per UR Regulation:    If discussed at Long Length of Stay Meetings, dates discussed:    Comments:  03-21-14 Shatara at  [facility] , [facility] [phone] . Ronny FlurryHeather Saraphina Lauderbaugh RN BSN     03-21-14 Nedra HaiLee  with [facility] [phone] called and faxed letter with instructions on how patient can get prescriptions filled at discharge . Letter given to patient and explained by interpreter . Ronny FlurryHeather Georgianne Gritz RN BSN    03-21-14 faxed MD signed orders and todays progress note  [facility] [phone] [facility] and to  OntonShatara at  [phone] , fax [phone] .  Per  [phone] , fax [phone] , home health will be provided by [facility] [phone], start of care will be Monday March 24, 2014.    Crutches and 3 in 1 will be delivered to patients hospital room today 03-21-14 , dressing supplies will be delivered to patient's home .  Patient will need a prescription for silvadene and take it to a pharmacy and have it filled .   He will have an interpreter to go with him to MD appointments . Discharge summary with follow up appointments  will need to be faxed to workers comp .  Above was explained to patient by  Emeterio ReeveShatara at  [facility] to patient via phone with an interpreter.   Ronny FlurryHeather Shizuo Biskup RN BSN   03-20-14 Received call from ShoshoniShatara at  [facility] [phone] , fax [phone] . She will be contact person to set up home health and DME .  She will need orders for [facility] and DME signed by Dr Lajoyce Cornersuda and faxed to her . Spoke with Dr Audrie Liauda's nurse Autumn . Autumn make Dr Lajoyce Cornersuda aware . NCM will fax once signed by MD .  PT recommending crutches and 3 in 1 at present.  Ronny FlurryHeather Philemon Riedesel RN BSN    03-20-14 Left voice mail with Adaline [facility]Paige Mims regarding no VAC needed but skilled nursing wound dressing changes 3 times a week at discharge with Silvadene dressing changes. Awaiting call back with which home health agency to use . Ronny FlurryHeather Demarques Pilz RN BSN    03-19-14 Adjustor has changed to [employer]  [phone] ext [phone] , she is requesting clinicals be faxed to [phone] , same done . After Ms Cecilio AsperSims has review clinical she will call NCM with name of home health agency.  Instructed to call [facility] for [facility] . [facility] [phone] , she will reach out to Ms [facility] for approval for crutches , rolling walker and VAC , order number [id number]  Ronny FlurryHeather Osman Calzadilla RN BSN   03-19-14 Patient will need home VAC . Faxed VAC prescription to Dr Audrie Liauda's office for signature. Still awaiting workers comp information. Ronny FlurryHeather Davis Vannatter RN BSN   03-19-14 Patient Worker's Comp , Mudloggercalled employer [employer] [phone]  spoke with Angelique Blonderenise . Angelique BlonderDenise will found uot patient's Workers Comp Adjustor name and contact number and call NCM back .  Also , spoke with Dr Audrie Liauda's nurse / assistant Lou CalAutumn , she will check with DR Lajoyce Cornersuda to see if patient will need VAC at discharge for home.  Ronny FlurryHeather Krystian Younglove RN BSN [phone]
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
## 2014-03-19 NOTE — Progress Notes (Signed)
Orthopedic Tech Progress Note Patient Details:  Derek NeerRaul Guzman 05/30/1976 [id number]  Ortho Devices Type of Ortho Device: CAM walker Ortho Device/Splint Location: post op shoe is applied to the right foot of patient.  Ortho Device/Splint Interventions: Application   Cammer, Mickie BailJennifer Carol 03/19/2014, 9:02 AM

## 2014-03-19 NOTE — Progress Notes (Signed)
Patient ID: Derek NeerRaul Guzman, male   DOB: 02/28/1976, 38 y.o.   MRN: [id number] Postoperative day 1 status post open reduction internal fixation ankle fracture as well as compartment release for compartment syndrome from crush injury to the right lower extremity. The wound VAC is functioning well. Plan for progressive ambulation nonweightbearing on the right. Anticipate discharge in several days when safe with transfers and ambulation.

## 2014-03-19 NOTE — Progress Notes (Signed)
Wound vac beeping noted air leaking, reinforced with opsite dsg. No more high air leak noted after dsg reinforcement.
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
## 2014-03-20 NOTE — Progress Notes (Signed)
Patient ID: Derek Guzman, male   DOB: 11/05/1975, 38 y.o.   MRN: [id number] Plan for a wound VAC changed today. This is an incisional wound VAC. Anticipate discharge to home over the weekend. Anticipate patient would not need a wound VAC for discharge but will require skilled nursing wound dressing changes 3 times a week at discharge with Silvadene dressing changes.

## 2014-03-21 ENCOUNTER — Encounter (HOSPITAL_COMMUNITY): Payer: Self-pay | Admitting: *Deleted

## 2014-03-21 NOTE — Progress Notes (Signed)
Patient ID: Derek Guzman, male   DOB: 12/18/1975, 38 y.o.   MRN: [id number] Patient feeling much better. Calf is soft and nontender. No signs of any residual compartment syndrome. Wound VAC functioning well. Will plan to discontinue the wound VAC on Saturday and discharged to home on Saturday.

## 2014-03-21 NOTE — Discharge Instructions (Addendum)
home health will be provided by [facility] [phone]  You  will need a prescription for silvadene at discharge  and take it to a pharmacy and have it filled .

## 2014-03-21 NOTE — Progress Notes (Signed)
Interpreter Derek Guzman for Care managment 

## 2014-03-21 NOTE — Progress Notes (Signed)
Physical Therapy Treatment Patient Details Name: Derek Guzman MRN: [id number] DOB: 10/30/1975 Today's Date: 03/21/2014    History of Present Illness Pt sustained a crush injury to R ankle and lower leg.  Conservative treatment failed.  Pt comes in for management of compartment syndrome and medial/lateral malleolar fractures.  VAC was placed    PT Comments    Patient making good progress with mobility and gait with crutches.  Will attempt stairs in am (patient declined today.)  Follow Up Recommendations  No PT follow up;Supervision for mobility/OOB     Equipment Recommendations  Crutches;3in1 (PT)    Recommendations for Other Services       Precautions / Restrictions Precautions Precautions: Fall Restrictions Weight Bearing Restrictions: Yes RLE Weight Bearing: Non weight bearing    Mobility  Bed Mobility Overal bed mobility: Modified Independent Bed Mobility: Supine to Sit     Supine to sit: Modified independent (Device/Increase time)     General bed mobility comments: Increased time.  Use of bed rail.  Transfers Overall transfer level: Needs assistance Equipment used: Crutches Transfers: Sit to/from Stand Sit to Stand: Min guard         General transfer comment: cues/demo of safe transfers with crutches; steady assist at times from lower bench seat.  Ambulation/Gait Ambulation/Gait assistance: Min guard Ambulation Distance (Feet): 240 Feet (with 2 seated rest breaks) Assistive device: Crutches Gait Pattern/deviations: Step-to pattern Gait velocity: Decreased; but improved over last session Gait velocity interpretation: Below normal speed for age/gender General Gait Details: Patient demonstrates safe use of crutches, with correct gait sequence.  Cues to move at safe speed.  Fairly good speed with gait.   Stairs Stairs:  (Patient declined today)          Engineer, drillingWheelchair Mobility    Modified Rankin (Stroke Patients Only)       Balance                                     Cognition Arousal/Alertness: Awake/alert Behavior During Therapy: WFL for tasks assessed/performed Overall Cognitive Status: Within Functional Limits for tasks assessed                      Exercises      General Comments        Pertinent Vitals/Pain Pain 10/10 with mobility.  RN provided pain meds.    Home Living                      Prior Function            PT Goals (current goals can now be found in the care plan section) Progress towards PT goals: Progressing toward goals    Frequency  Min 5X/week    PT Plan Current plan remains appropriate    Co-evaluation             End of Session Equipment Utilized During Treatment: Gait belt Activity Tolerance: Patient tolerated treatment well Patient left: in chair;with call bell/phone within reach     Time: 1302-1330 PT Time Calculation (min): 28 min  Charges:  $Gait Training: 23-37 mins                    G Codes:      Vena AustriaDavis, Sabiha Sura H 03/21/2014, 1:47 PM Durenda HurtSusan H. Renaldo Fiddleravis, PT, [facility] Pager [phone]

## 2014-03-22 MED ORDER — OXYCODONE-ACETAMINOPHEN 5-325 MG PO TABS: 1.0000 | ORAL_TABLET | ORAL | Status: DC | PRN

## 2014-03-22 MED ORDER — ASPIRIN EC 81 MG PO TBEC: 81.0000 mg | DELAYED_RELEASE_TABLET | Freq: Every day | ORAL | Status: DC

## 2014-03-22 NOTE — Discharge Summary (Signed)
Physician Discharge Summary  Patient ID: Derek Guzman MRN: [id number] DOB/AGE: 38/05/1976 38 y.o.  Admit date: 03/18/2014 Discharge date: 03/22/2014  Admission Diagnoses: Crush injury right leg with compartment syndrome of the leg and foot with traumatic ankle fracture dislocation  Discharge Diagnoses: Same Active Problems:   Compartment syndrome of lower extremity, traumatic   Discharged Condition: stable  Hospital Course: Patient's hospital course was essentially unremarkable. He underwent emergent surgery for compartment syndrome of the leg and foot decompression fasciotomies excision of necrotic muscle. Patient underwent open reduction and total fixation of the ankle fracture as well as placement of a wound VAC. Postoperatively patient progressed well and was discharged to home in stable condition.  Consults: None  Significant Diagnostic Studies: labs: Routine labs  Treatments: surgery: See operative note  Discharge Exam: Blood pressure 111/61, pulse 56, temperature 98.2 F (36.8 C), temperature source Oral, resp. rate 15, height 5\' 10"  (1.778 m), weight 102.513 kg (226 lb), SpO2 100.00%. Incision/Wound: incisions with healthy granulation tissue.  Disposition: Final discharge disposition not confirmed     Medication List    Notice   You have not been prescribed any medications.         Follow-up Information   Follow up with Koryn Charlot V, MD In 1 week.   Specialty:  Orthopedic Surgery   Contact information:   [address] [place] [address] [phone]       Signed: Nadara MustardDUDA,Margurette Brener V 03/22/2014, 9:05 AM

## 2014-03-22 NOTE — Progress Notes (Signed)
AVS discharge was given and went over with patient through a Spanish interpreter from  [employer]. Patient was given prescription for oxycodone-acetaminophen to take to his pharmacy.  Patient had his 3-in-1 BSC and crutchers  to take home. Patients questions about his medications, home health, and doctor appointment were answered . Staff assisted patient to his transportation.

## 2014-03-22 NOTE — Progress Notes (Signed)
Physical Therapy Treatment Patient Details Name: Derek NeerRaul Guzman MRN: [id number] DOB: 11/24/1975 Today's Date: 03/22/2014    History of Present Illness      PT Comments    Plan is for d/c home today.  Stair training complete.  Pt will have needed level of assist at home.  Follow Up Recommendations  No PT follow up;Supervision for mobility/OOB     Equipment Recommendations  Crutches;3in1 (PT)    Recommendations for Other Services       Precautions / Restrictions Precautions Required Braces or Orthoses: Other Brace/Splint Other Brace/Splint: CAM boot RLE Restrictions RLE Weight Bearing: Non weight bearing    Mobility  Bed Mobility Overal bed mobility: Modified Independent                Transfers   Equipment used: Crutches   Sit to Stand: Supervision            Ambulation/Gait Ambulation/Gait assistance: Min guard Ambulation Distance (Feet): 150 Feet Assistive device: Crutches Gait Pattern/deviations: Step-to pattern Gait velocity: decreased       Stairs Stairs: Yes Stairs assistance: Min guard Stair Management: With crutches;Forwards Number of Stairs: 3    Wheelchair Mobility    Modified Rankin (Stroke Patients Only)       Balance                                    Cognition Arousal/Alertness: Awake/alert Behavior During Therapy: WFL for tasks assessed/performed Overall Cognitive Status: Within Functional Limits for tasks assessed                      Exercises      General Comments        Pertinent Vitals/Pain 8/10    Home Living                      Prior Function            PT Goals (current goals can now be found in the care plan section) Progress towards PT goals: Progressing toward goals    Frequency  Min 5X/week    PT Plan Current plan remains appropriate    Co-evaluation             End of Session Equipment Utilized During Treatment: Gait belt Activity  Tolerance: Patient tolerated treatment well Patient left: in chair;with call bell/phone within reach;with family/visitor present     Time: [id number] PT Time Calculation (min): 26 min  Charges:  $Gait Training: 23-37 mins                    G Codes:      Ilda FoilGarrow, [name] 03/22/2014, 10:59 AM  Aida RaiderWendy Ara Mano, PT  Office # [phone] Pager [phone]

## 2014-03-29 ENCOUNTER — Encounter (HOSPITAL_COMMUNITY): Payer: Self-pay | Admitting: Emergency Medicine

## 2014-03-29 ENCOUNTER — Emergency Department (HOSPITAL_COMMUNITY)
Admission: EM | Admit: 2014-03-29 | Discharge: 2014-03-30 | Disposition: A | Discharge status: Home / Self Care | Payer: Worker's Compensation | Attending: Emergency Medicine | Admitting: Emergency Medicine

## 2014-03-29 DIAGNOSIS — T8140XA Infection following a procedure, unspecified, initial encounter: Secondary | ICD-10-CM | POA: Insufficient documentation

## 2014-03-29 DIAGNOSIS — Z7982 Long term (current) use of aspirin: Secondary | ICD-10-CM | POA: Insufficient documentation

## 2014-03-29 DIAGNOSIS — Y838 Other surgical procedures as the cause of abnormal reaction of the patient, or of later complication, without mention of misadventure at the time of the procedure: Secondary | ICD-10-CM | POA: Insufficient documentation

## 2014-03-29 MED ORDER — AMOXICILLIN-POT CLAVULANATE 875-125 MG PO TABS
1.0000 | ORAL_TABLET | Freq: Once | ORAL | Status: AC
  Administered 2014-03-29: 1 via ORAL
  Filled 2014-03-29: qty 1

## 2014-03-29 NOTE — ED Notes (Signed)
Patient had surgery on his right lower leg 1 week ago.  Today noted bloody drainage.  Called the surgeon but no answer.

## 2014-03-29 NOTE — ED Provider Notes (Signed)
CSN: [id number]     Arrival date & time 03/29/14  2049 History   First MD Initiated Contact with Patient 03/29/14 2300     Chief Complaint  Patient presents with  . Post-op Problem     (Consider location/radiation/quality/duration/timing/severity/associated sxs/prior Treatment) HPI Comments: 38 year old male with history of right lower extremity compartment syndrome after crush injury. He had a fracture dislocation of his ankle which required significant surgery, the patient was discharged on July 11 and has done well. He first started noticing drainage including blood and foul-smelling pus from this wound soaking through his dressings today. This has been persistent, associated pain but this is well controlled with Percocet and he denies any fevers at this time. He has been on aspirin for fevers at home though he denies having and objective fever, Percocet for pain which is treating his pain well. He is nonambulatory on his foot.  The history is provided by the patient and medical records.    History reviewed. No pertinent past medical history. Past Surgical History  Procedure Laterality Date  . Dental fillings    . Orif ankle fracture Right 03/18/2014    Procedure: OPEN REDUCTION INTERNAL FIXATION (ORIF) ANKLE FRACTURE;  Surgeon: Nadara Mustard, MD;  Location: [facility] OR;  Service: Orthopedics;  Laterality: Right;  . Fasciotomy Right 03/18/2014    Procedure: FASCIOTOMY;  Surgeon: Nadara Mustard, MD;  Location: [facility];  Service: Orthopedics;  Laterality: Right;  . Application of wound vac Right 03/18/2014    Procedure: APPLICATION OF WOUND VAC;  Surgeon: Nadara Mustard, MD;  Location: [facility] OR;  Service: Orthopedics;  Laterality: Right;   History reviewed. No pertinent family history. History  Substance Use Topics  . Smoking status: Never Smoker   . Smokeless tobacco: Not on file  . Alcohol Use: Yes    Review of Systems  All other systems reviewed and are negative.     Allergies  Review of  patient's allergies indicates no known allergies.  Home Medications   Prior to Admission medications   Medication Sig Start Date End Date Taking? Authorizing Provider  aspirin EC 81 MG tablet Take 1 tablet (81 mg total) by mouth daily. 03/22/14  Yes Nadara Mustard, MD  oxyCODONE-acetaminophen (ROXICET) 5-325 MG per tablet Take 1 tablet by mouth every 4 (four) hours as needed for severe pain. 03/22/14  Yes Nadara Mustard, MD  amoxicillin-clavulanate (AUGMENTIN) 875-125 MG per tablet Take 1 tablet by mouth every 12 (twelve) hours. 03/30/14   Vida Roller, MD  sulfamethoxazole-trimethoprim (SEPTRA DS) 800-160 MG per tablet Take 1 tablet by mouth every 12 (twelve) hours. 03/30/14   Vida Roller, MD   BP 116/65  Pulse 57  Temp(Src) 98.3 F (36.8 C)  Resp 18  SpO2 98% Physical Exam  Nursing note and vitals reviewed. Constitutional: He appears well-developed and well-nourished. No distress.  HENT:  Head: Normocephalic and atraumatic.  Mouth/Throat: Oropharynx is clear and moist. No oropharyngeal exudate.  Eyes: Conjunctivae and EOM are normal. Pupils are equal, round, and reactive to light. Right eye exhibits no discharge. Left eye exhibits no discharge. No scleral icterus.  Neck: Normal range of motion. Neck supple. No JVD present. No thyromegaly present.  Cardiovascular: Normal rate, regular rhythm, normal heart sounds and intact distal pulses.  Exam reveals no gallop and no friction rub.   No murmur heard. Pulmonary/Chest: Effort normal and breath sounds normal. No respiratory distress. He has no wheezes. He has no rales.  Abdominal: Soft.  Bowel sounds are normal. He exhibits no distension and no mass. There is no tenderness.  Musculoskeletal: Normal range of motion. He exhibits edema and tenderness.  Right lower extremity with swelling below the knee, wounds have been repaired with loose suturing, facilitating drainage, foul-smelling purulent drainage from right ankle and right lateral calf  as well as the dorsum of the foot, medial calf wound closed and appears clean  Lymphadenopathy:    He has no cervical adenopathy.  Neurological: He is alert. Coordination normal.  Skin: Skin is warm and dry. No rash noted. No erythema.  Psychiatric: He has a normal mood and affect. His behavior is normal.    ED Course  Procedures (including critical care time) Labs Review Labs Reviewed - No data to display  Imaging Review Dg Tibia/fibula Right  03/30/2014   CLINICAL DATA:  Ankle surgery 1 week ago  EXAM: RIGHT TIBIA AND FIBULA - 2 VIEW  COMPARISON:  None.  FINDINGS: Right distal fibular diaphysis fracture transfixed by a lateral sideplate and multiple interlocking screws. There is osteogenic material at the fracture site. There are 2 transmalleolar screws transfixing a medial malleolar fracture. There is no other fracture or dislocation. Unremarkable soft tissues.  IMPRESSION: 1. Right distal fibular diaphysis fracture transfixed by a lateral sideplate and multiple interlocking screws. There is osteogenic material at the fracture site. 2. There are 2 transmalleolar screws transfixing a medial malleolar fracture.   Electronically Signed   By: Elige KoHetal  Patel   On: 03/30/2014 00:57      MDM   Final diagnoses:  Post op infection    The patient is draining wounds from his extremity surgery, vital signs without tachycardia or fever, suspect infection in the wounds from his fasciotomies, we'll discuss with orthopedics on call, anticipate starting antibiotics, patient states his pain is well-controlled, reapply sterile dressings.  D/w Dr. Ophelia CharterYates -a grees with f/u on Monday - no free air on xray, abx given  Meds given in ED:  Medications  amoxicillin-clavulanate (AUGMENTIN) 875-125 MG per tablet 1 tablet (1 tablet Oral Given 03/29/14 2357)    New Prescriptions   AMOXICILLIN-CLAVULANATE (AUGMENTIN) 875-125 MG PER TABLET    Take 1 tablet by mouth every 12 (twelve) hours.    SULFAMETHOXAZOLE-TRIMETHOPRIM (SEPTRA DS) 800-160 MG PER TABLET    Take 1 tablet by mouth every 12 (twelve) hours.      Vida RollerBrian D Ashyia Schraeder, MD 03/30/14 [phone]

## 2014-03-30 ENCOUNTER — Emergency Department (HOSPITAL_COMMUNITY): Payer: Worker's Compensation

## 2014-03-30 MED ORDER — SULFAMETHOXAZOLE-TRIMETHOPRIM 800-160 MG PO TABS
1.0000 | ORAL_TABLET | Freq: Two times a day (BID) | ORAL | Status: DC
Start: 2014-03-30 — End: 2014-04-14

## 2014-03-30 MED ORDER — AMOXICILLIN-POT CLAVULANATE 875-125 MG PO TABS: 1.0000 | ORAL_TABLET | Freq: Two times a day (BID) | ORAL | Status: DC

## 2014-04-14 ENCOUNTER — Emergency Department (HOSPITAL_COMMUNITY): Payer: Worker's Compensation

## 2014-04-14 ENCOUNTER — Encounter (HOSPITAL_COMMUNITY): Payer: Self-pay | Admitting: Emergency Medicine

## 2014-04-14 ENCOUNTER — Emergency Department (HOSPITAL_COMMUNITY)
Admission: EM | Admit: 2014-04-14 | Discharge: 2014-04-14 | Disposition: A | Discharge status: Home / Self Care | Payer: Worker's Compensation | Attending: Emergency Medicine | Admitting: Emergency Medicine

## 2014-04-14 DIAGNOSIS — Z7982 Long term (current) use of aspirin: Secondary | ICD-10-CM | POA: Insufficient documentation

## 2014-04-14 DIAGNOSIS — M79609 Pain in unspecified limb: Secondary | ICD-10-CM | POA: Insufficient documentation

## 2014-04-14 DIAGNOSIS — Z792 Long term (current) use of antibiotics: Secondary | ICD-10-CM | POA: Insufficient documentation

## 2014-04-14 DIAGNOSIS — Z9189 Other specified personal risk factors, not elsewhere classified: Secondary | ICD-10-CM | POA: Insufficient documentation

## 2014-04-14 DIAGNOSIS — R55 Syncope and collapse: Secondary | ICD-10-CM | POA: Insufficient documentation

## 2014-04-14 DIAGNOSIS — S81801S Unspecified open wound, right lower leg, sequela: Secondary | ICD-10-CM

## 2014-04-14 DIAGNOSIS — R404 Transient alteration of awareness: Secondary | ICD-10-CM | POA: Insufficient documentation

## 2014-04-14 DIAGNOSIS — R609 Edema, unspecified: Secondary | ICD-10-CM | POA: Insufficient documentation

## 2014-04-14 LAB — I-STAT CHEM 8, ED
BUN: 16 mg/dL (ref 6–23)
CALCIUM ION: 1.21 mmol/L (ref 1.12–1.23)
Chloride: 102 mEq/L (ref 96–112)
Creatinine, Ser: 0.8 mg/dL (ref 0.50–1.35)
Glucose, Bld: 106 mg/dL — ABNORMAL HIGH (ref 70–99)
HEMATOCRIT: 42 % (ref 39.0–52.0)
HEMOGLOBIN: 14.3 g/dL (ref 13.0–17.0)
Potassium: 4 mEq/L (ref 3.7–5.3)
Sodium: 141 mEq/L (ref 137–147)
TCO2: 27 mmol/L (ref 0–100)

## 2014-04-14 LAB — CBC WITH DIFFERENTIAL/PLATELET
BASOS PCT: 0 % (ref 0–1)
Basophils Absolute: 0 10*3/uL (ref 0.0–0.1)
EOS ABS: 0.1 10*3/uL (ref 0.0–0.7)
EOS PCT: 1 % (ref 0–5)
HEMATOCRIT: 36.8 % — AB (ref 39.0–52.0)
HEMOGLOBIN: 12.2 g/dL — AB (ref 13.0–17.0)
Lymphocytes Relative: 20 % (ref 12–46)
Lymphs Abs: 2 10*3/uL (ref 0.7–4.0)
MCH: 32 pg (ref 26.0–34.0)
MCHC: 33.2 g/dL (ref 30.0–36.0)
MCV: 96.6 fL (ref 78.0–100.0)
MONO ABS: 0.5 10*3/uL (ref 0.1–1.0)
MONOS PCT: 5 % (ref 3–12)
NEUTROS PCT: 74 % (ref 43–77)
Neutro Abs: 7.4 10*3/uL (ref 1.7–7.7)
Platelets: 252 10*3/uL (ref 150–400)
RBC: 3.81 MIL/uL — ABNORMAL LOW (ref 4.22–5.81)
RDW: 13.1 % (ref 11.5–15.5)
WBC: 9.9 10*3/uL (ref 4.0–10.5)

## 2014-04-14 MED ORDER — HYDROMORPHONE HCL PF 1 MG/ML IJ SOLN
1.0000 mg | Freq: Once | INTRAMUSCULAR | Status: AC
  Administered 2014-04-14: 1 mg via INTRAVENOUS
  Filled 2014-04-14: qty 1

## 2014-04-14 MED ORDER — SODIUM CHLORIDE 0.9 % IV BOLUS (SEPSIS)
1000.0000 mL | Freq: Once | INTRAVENOUS | Status: AC
  Administered 2014-04-14: 1000 mL via INTRAVENOUS

## 2014-04-14 MED ORDER — ONDANSETRON HCL 4 MG/2ML IJ SOLN
4.0000 mg | Freq: Once | INTRAMUSCULAR | Status: AC
  Administered 2014-04-14: 4 mg via INTRAVENOUS
  Filled 2014-04-14: qty 2

## 2014-04-14 MED ORDER — SILVER SULFADIAZINE 1 % EX CREA
TOPICAL_CREAM | Freq: Once | CUTANEOUS | Status: AC
  Administered 2014-04-14: 15:00:00 via TOPICAL
  Filled 2014-04-14: qty 85

## 2014-04-14 NOTE — ED Provider Notes (Signed)
  Face-to-face evaluation   History: syncope during dressing change. Transfer by EMS, no hypotension  Physical exam: Alert calm, comfortable. Initial vitals normal. Leg wound has intact dressing.  Medical screening examination/treatment/procedure(s) were conducted as a shared visit with non-physician practitioner(s) and myself.  I personally evaluated the patient during the encounter   Flint MelterElliott L Rayley Gao, MD 04/14/14 [phone]

## 2014-04-14 NOTE — ED Notes (Signed)
Pt arrived via ems related to syncopal episode during dressing change of crush injury to right leg. Per home health nurse states increase in drainage from wound. Pt alert x4 respirations easy non labored.

## 2014-04-14 NOTE — ED Notes (Signed)
Patient could only stand on left leg when performing the orthostatic vital signs

## 2014-04-14 NOTE — ED Provider Notes (Signed)
Medical screening examination/treatment/procedure(s) were performed by non-physician practitioner and as supervising physician I was immediately available for consultation/collaboration.  Flint MelterElliott L Kaelen Caughlin, MD 04/14/14 [phone]

## 2014-04-14 NOTE — ED Notes (Signed)
Pt alert x4 respirations easy non labored. WC to lobby

## 2014-04-14 NOTE — ED Provider Notes (Signed)
CSN: [id number]     Arrival date & time 04/14/14  1201 History   First MD Initiated Contact with Patient 04/14/14 1207     No chief complaint on file.    (Consider location/radiation/quality/duration/timing/severity/associated sxs/prior Treatment) HPI  38 year old male who recently had a right lower extremity compartment syndrome of a crush injury who is presenting complaining of having a syncope episode. Hx obtain through Spanish phone intepreter.  A month ago patient suffered a crush injury of his right lower extremities. He has a fracture dislocation of his R ankle that require significant surgery. 2 weeks ago he notice blood and foul smelling drainage from the surgical wound.  He was seen in the ER, orthopedic Dr. was consult and subsequent a patient was discharge with antibiotic and reapplication with sterile dressings.  He report a wound care specialist would show up every week to assess his wound. Today while removing dressing and assess the wound, it became so painful that he passed out.  Denies any prior sxs such as cp/sob/hemoptysis prior to passing out.  This is an isolated event.  He was sent here for further evaluation of this syncope.  No significant cardiac hx, a non smoker, no DOE, no productive cough, no fever.  Currently taking abx and pain medication, percocet.    No past medical history on file. Past Surgical History  Procedure Laterality Date  . Dental fillings    . Orif ankle fracture Right 03/18/2014    Procedure: OPEN REDUCTION INTERNAL FIXATION (ORIF) ANKLE FRACTURE;  Surgeon: Nadara Mustard, MD;  Location: [facility] OR;  Service: Orthopedics;  Laterality: Right;  . Fasciotomy Right 03/18/2014    Procedure: FASCIOTOMY;  Surgeon: Nadara Mustard, MD;  Location: [facility] OR;  Service: Orthopedics;  Laterality: Right;  . Application of wound vac Right 03/18/2014    Procedure: APPLICATION OF WOUND VAC;  Surgeon: Nadara Mustard, MD;  Location: [facility] OR;  Service: Orthopedics;  Laterality: Right;   No  family history on file. History  Substance Use Topics  . Smoking status: Never Smoker   . Smokeless tobacco: Not on file  . Alcohol Use: Yes    Review of Systems  All other systems reviewed and are negative.     Allergies  Review of patient's allergies indicates no known allergies.  Home Medications   Prior to Admission medications   Medication Sig Start Date End Date Taking? Authorizing Provider  amoxicillin-clavulanate (AUGMENTIN) 875-125 MG per tablet Take 1 tablet by mouth every 12 (twelve) hours. 03/30/14   Vida Roller, MD  aspirin EC 81 MG tablet Take 1 tablet (81 mg total) by mouth daily. 03/22/14   Nadara Mustard, MD  oxyCODONE-acetaminophen (ROXICET) 5-325 MG per tablet Take 1 tablet by mouth every 4 (four) hours as needed for severe pain. 03/22/14   Nadara Mustard, MD  sulfamethoxazole-trimethoprim (SEPTRA DS) 800-160 MG per tablet Take 1 tablet by mouth every 12 (twelve) hours. 03/30/14   Vida Roller, MD   There were no vitals taken for this visit. Physical Exam  Constitutional: He appears well-developed and well-nourished. No distress.  HENT:  Head: Atraumatic.  Eyes: Conjunctivae are normal.  Neck: Normal range of motion. Neck supple.  Cardiovascular: Normal rate and regular rhythm.   Pulmonary/Chest: Effort normal and breath sounds normal. No respiratory distress. He exhibits no tenderness.  Musculoskeletal: He exhibits tenderness (RLE: fasciotomy site appears non infected with normal granular tissue.  No obvious cellulitis or abscess on exam, pedal pulse faint  but palpable.  edema noted to lower leg, with diffused tenderness to palpation.  ).  Neurological: He is alert.  Skin: No rash noted.  Psychiatric: He has a normal mood and affect.    ED Course  Procedures (including critical care time)  1:59 PM Pt was sent here due to a syncope episode today while having dressing changed to his RLE from prior injury.  He is afebrile, VSS, no obsious signs of  infection on exam, normal WBC and pt nontoxic in appearance.  Doubt compartment syndrome at this time.  I suspect syncope due to vasal vagal from pain.  Pt has normal ECG, no anemia, normal orthostatic VS.  Care discussed with Dr. Effie ShyWentz.    Labs Review Labs Reviewed  CBC WITH DIFFERENTIAL - Abnormal; Notable for the following:    RBC 3.81 (*)    Hemoglobin 12.2 (*)    HCT 36.8 (*)    All other components within normal limits  I-STAT CHEM 8, ED - Abnormal; Notable for the following:    Glucose, Bld 106 (*)    All other components within normal limits    Imaging Review Dg Ankle Complete Right  04/14/2014   CLINICAL DATA:  Recent ankle injury, history of prior fracture with fixation  EXAM: RIGHT ANKLE - COMPLETE 3+ VIEW  COMPARISON:  03/30/2014  FINDINGS: Postsurgical changes are noted in the distal fibula and distal medial malleolus. Fixation sideplate is noted along the fibula with osteogenic material within the fracture site. The overall appearance is stable from the prior exam. Avulsion fracture from the base of the fifth metatarsal is again identified. No acute abnormality is seen.  IMPRESSION: Stable surgically repaired distal fibular and tibial fractures.   Electronically Signed   By: Alcide CleverMark  Lukens M.D.   On: 04/14/2014 13:31     EKG Interpretation None      Date: 04/14/2014  Rate: 59  Rhythm: normal sinus rhythm  QRS Axis: normal  Intervals: normal  ST/T Wave abnormalities: normal  Conduction Disutrbances: none  Narrative Interpretation:   Old EKG Reviewed: No significant changes noted     MDM   Final diagnoses:  Open wound, lower leg, right, sequela  Vasovagal syncope    BP 115/57  Pulse 59  Temp(Src) 98.3 F (36.8 C) (Oral)  Resp 11  SpO2 100%  I have reviewed nursing notes and vital signs. I personally reviewed the imaging tests through PACS system  I reviewed available ER/hospitalization records thought the EMR     Fayrene HelperBowie Vue Pavon, [place] JerseyPA-C 04/14/14 1453

## 2014-04-14 NOTE — ED Notes (Signed)
Silvadene wet to dry dressing applied to rll. Pt tolerated well.

## 2014-04-14 NOTE — ED Notes (Signed)
Patient talking on cell phone

## 2014-04-14 NOTE — ED Notes (Signed)
Patient transported to X-ray 

## 2014-04-14 NOTE — Discharge Instructions (Signed)
Please continue with antibiotic and pain medication as previously prescribed.  Follow up with your orthopedist doctor for further care.  Return if your symptoms worsen or if you have other concerns.    Sncope vasovagal - Adultos (Vasovagal Syncope, Adult) El sncope, comnmente conocido como Sedandesmayo, es una prdida transitoria de la conciencia. Se produce cuando se reduce el flujo sanguneo al cerebro. El sncope vasovagal (tambin llamado sncope neurocardiognico) es un desmayo en el que el flujo de sangre al cerebro se reduce a causa de una cada repentina en la frecuencia cardaca y la presin arterial. El sncope vasovagal se produce cuando el cerebro y el sistema cardiovascular (vasos sanguneos) no se comunican ni responden el uno al otro de [place]manera adecuada. Esta es la causa ms comn de [place] Hillsdesmayos. A menudo se produce como respuesta al miedo o algn otro tipo de estrs emocional o fsico. El cuerpo tiene una reaccin en la que el corazn comienza a latir muy lentamente o los vasos sanguneos se expanden, para reducir la presin arterial. Este tipo de desmayo se considera generalmente inofensivo. Sin embargo, pueden ocurrir lesiones si la persona tiene una cada repentina durante el Gilbertdesmayo.  CAUSAS  El sncope vasovagal se produce cuando la presin arterial y la frecuencia cardaca de una persona disminuyen de repente, por lo general en respuesta a un factor desencadenante. Muchas cosas y situaciones pueden desencadenar un episodio. Entre ellas se incluyen:   Dolor.   Temor.   Ver sangre o procedimientos mdicos, como la sangre que se extrae de una vena.   Las Brink's Companyactividades comunes, tales como toser, Financial controllerestirarse, o ir al bao.   Estrs emocional.   Permanecer mucho tiempo de pie, especialmente en un ambiente clido.   La falta de sueo o de descanso.   Prolongada falta de alimentos.   Prolongada falta de lquidos.   Enfermedad reciente.  El uso de ciertas drogas que afectan la  presin arterial, como la cocana, el alcohol, la marihuana, los inhalantes y los opiceos.  SNTOMAS  Antes del episodio de desmayo, es posible que:   Se sienta mareado o dbil.   Se vuelve plido.  Sienta que se va a desmayar.   Siente como si la habitacin diera vueltas.   Tiene una visin de tnel, slo ve lo que hay directamente frente a usted.   Siente malestar estomacal (nuseas).   Ve manchas o pierde poco a poco la visin.   Escucha un zumbido en los odos.   Tiene dolor de Turkmenistancabeza.  Se siente afiebrado y sudoroso.   Tiene sensacin de hinchazn u hormigueo. Durante el desmayo, por lo general, estar inconsciente durante no ms de un par de minutos antes de despertar y volver a la normalidad. Si usted se levanta demasiado rpido antes de que su cuerpo pueda recuperarse, se va a desmayar otra vez. Durante el desmayo pueden producirse movimientos espasmdicos o bruscos.  DIAGNSTICO  El mdico le preguntar United Stationerssobre los sntomas y le har una historia clnica y un examen fsico. Se pueden hacer varios estudios para Sales promotion account executivedescartar otras causas de los [place] Gardensdesmayos. Estos pueden incluir anlisis de Tajikistansangre y pruebas para revisar el corazn, como un Midwifeelectrocardiograma ecocardiografa, y posiblemente, un estudio de Actorelectrofisiologa. Cuando otras causas han sido descartadas, se puede Education officer, environmentalrealizar un examen para comprobar la respuesta del organismo a los cambios de posicin (prueba de la mesa basculante).  TRATAMIENTO  La mayora de los casos de sncope vasovagal no requieren TEFL teachertratamiento. Su mdico le puede Financial plannerrecomendar maneras de El [place] Corporationevitar los desencadenantes  de desmayos y puede proporcionarle estrategias caseras para prevenir desmayos. Si tiene que estar expuesto a un posible desencadenante, puede beber lquidos adicionales para ayudar a reducir sus probabilidades de sufrir un episodio de sncope vasovagal. Si hay signos de advertencia de un episodio que se acerca, usted puede responder  posicionndose favorablemente (acostarse).  Si los [employer], se le pueden dar medicamentos para evitarlos. Algunos medicamentos pueden ayudar a hacerlo ms resistente a episodios repetidos de sncope vasovagal. Se pueden recomendar ejercicios especiales o medias de compresin. En raros casos, se considera la colocacin quirrgica de un marcapasos.  INSTRUCCIONES PARA EL CUIDADO DOMICILIARIO   Aprenda a identificar las seales del sncope vasovagal.   Sintese o acustese a la primera seal de un desmayo. Si se sienta, ponga su cabeza [employer]. Si usted se [place], coloque las piernas [place] para aumentar el flujo de sangre al cerebro.   Evite los baos calientes y los saunas.  Evite estar mucho tiempo de pie.  Debe ingerir gran cantidad de lquido para mantener la orina de tono claro o color amarillo plido. Evite la cafena.  Aumente la sal en su dieta siguiendo las indicaciones de su mdico.   Si tiene que estar parado durante mucho tiempo, realice movimientos tales como:   Psychologist, counselling las piernas.   Flexionar y Furniture conservator/restorer los msculos de las piernas.   Ponerse en cuclillas.   Mover las piernas.   Doblarse.   Tome slo medicamentos de venta libre o recetados, segn las indicaciones del mdico. No deje de tomar ningn medicamento sin consultar con su mdico primero. SOLICITE ATENCIN MDICA SI:   Sus desmayos continan o suceden con mayor frecuencia a pesar del tratamiento.   Pierde el conocimiento durante ms de un par de minutos.  Se ha desmayado durante o despus de hacer ejercicio o si se sorprende.   Usted tiene nuevos sntomas que se producen con los [place], tales como:   Falta de [place].  Dolor en el pecho.   Latidos cardacos irregulares.   Usted tiene episodios de espasmos o movimientos bruscos que duran ms de unos pocos segundos.  Usted tiene episodios de espasmos o movimientos bruscos sin desmayos. BUSQUE ATENCIN MDICA DE  INMEDIATO SI:   Usted tiene lesiones o hemorragia despus de un desmayo.   Usted tiene episodios de espasmos o movimientos bruscos que duran ms de 5 minutos.   Usted tiene ms de un episodio de movimientos espasmdicos antes de Copy la consciencia despus de un desmayo. ASEGRESE DE QUE:   Comprende estas instrucciones.  Controlar su enfermedad.  Solicitar ayuda de inmediato si no mejora o si empeora. Document Released: 12/24/2012 [facility] Patient Information 2015 [place], [place]. This information is not intended to replace advice given to you by your health care provider. Make sure you discuss any questions you have with your health care provider.

## 2014-04-17 ENCOUNTER — Encounter (HOSPITAL_COMMUNITY): Payer: Self-pay | Admitting: *Deleted

## 2014-04-17 ENCOUNTER — Other Ambulatory Visit (HOSPITAL_COMMUNITY): Payer: Self-pay | Admitting: Orthopedic Surgery

## 2014-04-17 MED ORDER — CEFAZOLIN SODIUM-DEXTROSE 2-3 GM-% IV SOLR
2.0000 g | INTRAVENOUS | Status: AC
  Administered 2014-04-18: 2 g via INTRAVENOUS
  Filled 2014-04-17: qty 50

## 2014-04-17 NOTE — Progress Notes (Signed)
Pre-op call done via [employer].

## 2014-04-17 NOTE — Progress Notes (Signed)
Pt states that when he had his accident with his leg, he bit down on a rock to help with the pain. He states now his teeth hurt and feel loose. He stated that he told Dr. Lajoyce Cornersuda, and he pushed on them and told him he thought they were ok. Pt states that they still hurt.

## 2014-04-18 ENCOUNTER — Ambulatory Visit (HOSPITAL_COMMUNITY)
Admission: RE | Admit: 2014-04-18 | Discharge: 2014-04-18 | Disposition: A | Discharge status: Home / Self Care | Payer: Worker's Compensation | Source: Ambulatory Visit | Attending: Orthopedic Surgery | Admitting: Orthopedic Surgery

## 2014-04-18 ENCOUNTER — Encounter (HOSPITAL_COMMUNITY)
Admission: RE | Disposition: A | Discharge status: Home / Self Care | Payer: Self-pay | Source: Ambulatory Visit | Attending: Orthopedic Surgery

## 2014-04-18 ENCOUNTER — Ambulatory Visit (HOSPITAL_COMMUNITY): Payer: Worker's Compensation | Admitting: Certified Registered"

## 2014-04-18 ENCOUNTER — Encounter (HOSPITAL_COMMUNITY): Payer: Worker's Compensation | Admitting: Certified Registered"

## 2014-04-18 ENCOUNTER — Encounter (HOSPITAL_COMMUNITY): Payer: Self-pay | Admitting: Certified Registered"

## 2014-04-18 DIAGNOSIS — S81801D Unspecified open wound, right lower leg, subsequent encounter: Secondary | ICD-10-CM

## 2014-04-18 DIAGNOSIS — S91001D Unspecified open wound, right ankle, subsequent encounter: Secondary | ICD-10-CM

## 2014-04-18 DIAGNOSIS — S81001D Unspecified open wound, right knee, subsequent encounter: Secondary | ICD-10-CM

## 2014-04-18 DIAGNOSIS — L905 Scar conditions and fibrosis of skin: Secondary | ICD-10-CM | POA: Insufficient documentation

## 2014-04-18 HISTORY — DX: Other specified postprocedural states: Z98.890

## 2014-04-18 HISTORY — DX: Nausea with vomiting, unspecified: R11.2

## 2014-04-18 HISTORY — DX: Other complications of anesthesia, initial encounter: T88.59XA

## 2014-04-18 HISTORY — DX: Nausea with vomiting, unspecified: Z98.890

## 2014-04-18 HISTORY — DX: Syncope and collapse: R55

## 2014-04-18 HISTORY — DX: Adverse effect of unspecified anesthetic, initial encounter: T41.45XA

## 2014-04-18 HISTORY — DX: Other specified postprocedural states: R11.2

## 2014-04-18 HISTORY — PX: I & D EXTREMITY: SHX5045

## 2014-04-18 LAB — COMPREHENSIVE METABOLIC PANEL
ALBUMIN: 3.9 g/dL (ref 3.5–5.2)
ALT: 25 U/L (ref 0–53)
AST: 16 U/L (ref 0–37)
Alkaline Phosphatase: 100 U/L (ref 39–117)
Anion gap: 12 (ref 5–15)
BUN: 16 mg/dL (ref 6–23)
CALCIUM: 9.3 mg/dL (ref 8.4–10.5)
CO2: 26 meq/L (ref 19–32)
Chloride: 105 mEq/L (ref 96–112)
Creatinine, Ser: 0.8 mg/dL (ref 0.50–1.35)
GFR calc Af Amer: >90 mL/min (ref >90)
Glucose, Bld: 103 mg/dL — ABNORMAL HIGH (ref 70–99)
Potassium: 4.3 mEq/L (ref 3.7–5.3)
Sodium: 143 mEq/L (ref 137–147)
Total Bilirubin: 0.9 mg/dL (ref 0.3–1.2)
Total Protein: 7.4 g/dL (ref 6.0–8.3)

## 2014-04-18 LAB — CBC
HEMATOCRIT: 38.5 % — AB (ref 39.0–52.0)
Hemoglobin: 12.8 g/dL — ABNORMAL LOW (ref 13.0–17.0)
MCH: 31.7 pg (ref 26.0–34.0)
MCHC: 33.2 g/dL (ref 30.0–36.0)
MCV: 95.3 fL (ref 78.0–100.0)
PLATELETS: 230 10*3/uL (ref 150–400)
RBC: 4.04 MIL/uL — ABNORMAL LOW (ref 4.22–5.81)
RDW: 13.1 % (ref 11.5–15.5)
WBC: 9.1 10*3/uL (ref 4.0–10.5)

## 2014-04-18 LAB — PROTIME-INR
INR: 1.12 (ref 0.00–1.49)
PROTHROMBIN TIME: 14.4 s (ref 11.6–15.2)

## 2014-04-18 LAB — APTT: aPTT: 32 seconds (ref 24–37)

## 2014-04-18 SURGERY — IRRIGATION AND DEBRIDEMENT EXTREMITY
Anesthesia: General | Site: Ankle | Laterality: Right

## 2014-04-18 MED ORDER — MIDAZOLAM HCL 2 MG/2ML IJ SOLN
INTRAMUSCULAR | Status: AC
  Filled 2014-04-18: qty 2

## 2014-04-18 MED ORDER — LIDOCAINE HCL (CARDIAC) 20 MG/ML IV SOLN
INTRAVENOUS | Status: DC | PRN
  Administered 2014-04-18: 60 mg via INTRAVENOUS

## 2014-04-18 MED ORDER — FENTANYL CITRATE 0.05 MG/ML IJ SOLN
INTRAMUSCULAR | Status: AC
  Filled 2014-04-18: qty 5

## 2014-04-18 MED ORDER — HYDROMORPHONE HCL PF 1 MG/ML IJ SOLN
INTRAMUSCULAR | Status: AC
  Filled 2014-04-18: qty 1

## 2014-04-18 MED ORDER — SODIUM CHLORIDE 0.9 % IR SOLN
Status: DC | PRN
  Administered 2014-04-18: 1000 mL

## 2014-04-18 MED ORDER — LACTATED RINGERS IV SOLN
INTRAVENOUS | Status: DC
  Administered 2014-04-18: 13:00:00 via INTRAVENOUS

## 2014-04-18 MED ORDER — MIDAZOLAM HCL 5 MG/5ML IJ SOLN
INTRAMUSCULAR | Status: DC | PRN
  Administered 2014-04-18: 2 mg via INTRAVENOUS

## 2014-04-18 MED ORDER — ONDANSETRON HCL 4 MG/2ML IJ SOLN
INTRAMUSCULAR | Status: DC | PRN
  Administered 2014-04-18: 4 mg via INTRAVENOUS

## 2014-04-18 MED ORDER — FENTANYL CITRATE 0.05 MG/ML IJ SOLN
INTRAMUSCULAR | Status: DC | PRN
  Administered 2014-04-18: 100 ug via INTRAVENOUS

## 2014-04-18 MED ORDER — HYDROMORPHONE HCL PF 1 MG/ML IJ SOLN
0.2500 mg | INTRAMUSCULAR | Status: DC | PRN
  Administered 2014-04-18 (×2): 0.5 mg via INTRAVENOUS

## 2014-04-18 MED ORDER — PROPOFOL 10 MG/ML IV BOLUS
INTRAVENOUS | Status: DC | PRN
  Administered 2014-04-18: 200 mg via INTRAVENOUS

## 2014-04-18 MED ORDER — PROPOFOL 10 MG/ML IV BOLUS
INTRAVENOUS | Status: AC
  Filled 2014-04-18: qty 20

## 2014-04-18 SURGICAL SUPPLY — 45 items
BLADE SURG 10 STRL SS (BLADE) IMPLANT
BNDG COHESIVE 4X5 TAN STRL (GAUZE/BANDAGES/DRESSINGS) ×3 IMPLANT
BNDG COHESIVE 6X5 TAN STRL LF (GAUZE/BANDAGES/DRESSINGS) IMPLANT
BNDG GAUZE ELAST 4 BULKY (GAUZE/BANDAGES/DRESSINGS) ×3 IMPLANT
COVER SURGICAL LIGHT HANDLE (MISCELLANEOUS) ×3 IMPLANT
CUFF TOURNIQUET SINGLE 18IN (TOURNIQUET CUFF) IMPLANT
CUFF TOURNIQUET SINGLE 24IN (TOURNIQUET CUFF) IMPLANT
CUFF TOURNIQUET SINGLE 34IN LL (TOURNIQUET CUFF) IMPLANT
CUFF TOURNIQUET SINGLE 44IN (TOURNIQUET CUFF) IMPLANT
DRAPE U-SHAPE 47X51 STRL (DRAPES) ×3 IMPLANT
DRSG ADAPTIC 3X8 NADH LF (GAUZE/BANDAGES/DRESSINGS) IMPLANT
DRSG MEPITEL 4X7.2 (GAUZE/BANDAGES/DRESSINGS) ×3 IMPLANT
DRSG PAD ABDOMINAL 8X10 ST (GAUZE/BANDAGES/DRESSINGS) ×3 IMPLANT
DURAPREP 26ML APPLICATOR (WOUND CARE) IMPLANT
ELECT CAUTERY BLADE 6.4 (BLADE) IMPLANT
ELECT REM PT RETURN 9FT ADLT (ELECTROSURGICAL) ×3
ELECTRODE REM PT RTRN 9FT ADLT (ELECTROSURGICAL) ×1 IMPLANT
GAUZE SPONGE 4X4 12PLY STRL (GAUZE/BANDAGES/DRESSINGS) ×3 IMPLANT
GLOVE BIOGEL PI IND STRL 9 (GLOVE) ×1 IMPLANT
GLOVE BIOGEL PI INDICATOR 9 (GLOVE) ×2
GLOVE SURG ORTHO 9.0 STRL STRW (GLOVE) ×3 IMPLANT
GOWN STRL REUS W/ TWL XL LVL3 (GOWN DISPOSABLE) ×3 IMPLANT
GOWN STRL REUS W/TWL XL LVL3 (GOWN DISPOSABLE) ×6
HANDPIECE INTERPULSE COAX TIP (DISPOSABLE)
KIT BASIN OR (CUSTOM PROCEDURE TRAY) ×3 IMPLANT
KIT ROOM TURNOVER OR (KITS) ×3 IMPLANT
MANIFOLD NEPTUNE II (INSTRUMENTS) ×3 IMPLANT
NS IRRIG 1000ML POUR BTL (IV SOLUTION) ×3 IMPLANT
PACK ORTHO EXTREMITY (CUSTOM PROCEDURE TRAY) ×3 IMPLANT
PAD ARMBOARD 7.5X6 YLW CONV (MISCELLANEOUS) ×6 IMPLANT
PADDING CAST COTTON 6X4 STRL (CAST SUPPLIES) IMPLANT
SET HNDPC FAN SPRY TIP SCT (DISPOSABLE) IMPLANT
SPONGE GAUZE 4X4 12PLY STER LF (GAUZE/BANDAGES/DRESSINGS) ×3 IMPLANT
SPONGE LAP 18X18 X RAY DECT (DISPOSABLE) ×3 IMPLANT
STAPLER VISISTAT 35W (STAPLE) ×3 IMPLANT
STOCKINETTE IMPERVIOUS 9X36 MD (GAUZE/BANDAGES/DRESSINGS) IMPLANT
TISSUE THERASKIN 2X3 (Tissue) ×3 IMPLANT
TOWEL OR 17X24 6PK STRL BLUE (TOWEL DISPOSABLE) ×3 IMPLANT
TOWEL OR 17X26 10 PK STRL BLUE (TOWEL DISPOSABLE) ×3 IMPLANT
TUBE ANAEROBIC SPECIMEN COL (MISCELLANEOUS) IMPLANT
TUBE CONNECTING 12'X1/4 (SUCTIONS) ×1
TUBE CONNECTING 12X1/4 (SUCTIONS) ×2 IMPLANT
UNDERPAD 30X30 INCONTINENT (UNDERPADS AND DIAPERS) ×3 IMPLANT
WATER STERILE IRR 1000ML POUR (IV SOLUTION) IMPLANT
YANKAUER SUCT BULB TIP NO VENT (SUCTIONS) ×3 IMPLANT

## 2014-04-18 NOTE — Anesthesia Procedure Notes (Signed)
Procedure Name: LMA Insertion Date/Time: 04/18/2014 2:22 PM Performed by: Arlice ColtMANESS, Latarra Eagleton B Pre-anesthesia Checklist: Patient identified, Emergency Drugs available, Suction available, Patient being monitored and Timeout performed Patient Re-evaluated:Patient Re-evaluated prior to inductionOxygen Delivery Method: Circle system utilized Preoxygenation: Pre-oxygenation with 100% oxygen Intubation Type: IV induction LMA: LMA inserted LMA Size: 4.0 Number of attempts: 1 Placement Confirmation: positive ETCO2 and breath sounds checked- equal and bilateral Tube secured with: Tape Dental Injury: Teeth and Oropharynx as per pre-operative assessment

## 2014-04-18 NOTE — Transfer of Care (Signed)
Immediate Anesthesia Transfer of Care Note  Patient: Derek Guzman  Procedure(s) Performed: Procedure(s) with comments: IRRIGATION AND DEBRIDEMENT EXTREMITY (Right) - Right Ankle Manipulation Under Anesthesia, Apply Theraskin Graft  Patient Location: PACU  Anesthesia Type:General  Level of Consciousness: responds to stimulation  Airway & Oxygen Therapy: Patient Spontanous Breathing  Post-op Assessment: Report given to PACU RN and Post -op Vital signs reviewed and stable  Post vital signs: Reviewed and stable  Complications: No apparent anesthesia complications

## 2014-04-18 NOTE — Discharge Instructions (Signed)
Keep dressing dry, elevate leg above heart

## 2014-04-18 NOTE — H&P (Signed)
Derek Guzman is an 38 y.o. male.   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 Patient has undergone open reduction internal fixation of the ankle fracture. The crushed soft tissue has developed into a full thickness wound. The wound has improved to the point where it has a good granulation bed for a skin graft. 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  Past Medical History  Diagnosis Date  . Syncopal episodes   . Complication of anesthesia   . PONV (postoperative nausea and vomiting)     one day after surgery had vomiting    Past Surgical History  Procedure Laterality Date  . Dental fillings    . Orif ankle fracture Right 03/18/2014    Procedure: OPEN REDUCTION INTERNAL FIXATION (ORIF) ANKLE FRACTURE;  Surgeon: Nadara MustardMarcus Herberta Pickron V, MD;  Location: [facility] OR;  Service: Orthopedics;  Laterality: Right;  . Fasciotomy Right 03/18/2014    Procedure: FASCIOTOMY;  Surgeon: Nadara MustardMarcus Tasheba Henson V, MD;  [facility];  Service: Orthopedics;  Laterality: Right;  . Application of wound vac Right 03/18/2014    Procedure: APPLICATION OF WOUND VAC;  Surgeon: Nadara MustardMarcus Dawson Hollman V, MD;  Location: [facility] OR;  Service: Orthopedics;  Laterality: Right;    History reviewed. No pertinent family history. Social History:  reports that he has never smoked. He has never used smokeless tobacco. He reports that he drinks alcohol. He reports that he does not use illicit drugs.  Allergies: No Known Allergies  No prescriptions prior to admission    No results found for this or any previous visit (from the past 48 hour(s)). No results found.  ROS  There were no vitals taken for this visit. Physical Exam  On examination patient has good pulses. He has equinus contracture of the ankle. He has a full thickness wound with a  good granulation tissue base on the lateral aspect of the right ankle. The wound is approximately 5 cm in diameter. Assessment/Plan Assessment full-thickness wounds secondary to crush injury right ankle with ankylosis of the ankle status post open reduction internal fixation.  Plan: Will plan for manipulation under anesthesia the ankle. Will plan for allograft skin graft to the wound. Risks and benefits were discussed the importance of active and passive range of motion was discussed patient states he understands and wished to proceed at this time.  Derek Guzman 04/18/2014, 6:16 AM

## 2014-04-18 NOTE — Anesthesia Preprocedure Evaluation (Addendum)
Anesthesia Evaluation  Patient identified by MRN, date of birth, ID band Patient awake  General Assessment Comment:History noted. CE  Reviewed: Allergy & Precautions, H&P , NPO status , Patient's Chart, lab work & pertinent test results, reviewed documented beta blocker date and time   History of Anesthesia Complications (+) PONV  Airway Mallampati: I TM Distance: >3 FB Neck ROM: Full    Dental  (+) Teeth Intact, Dental Advisory Given   Pulmonary neg pulmonary ROS,  breath sounds clear to auscultation        Cardiovascular negative cardio ROS  Rhythm:Regular Rate:Normal     Neuro/Psych    GI/Hepatic negative GI ROS, Neg liver ROS,   Endo/Other  negative endocrine ROS  Renal/GU negative Renal ROS     Musculoskeletal   Abdominal   Peds  Hematology   Anesthesia Other Findings   Reproductive/Obstetrics                          Anesthesia Physical Anesthesia Plan  ASA: II  Anesthesia Plan: General   Post-op Pain Management:    Induction: Intravenous  Airway Management Planned: LMA  Additional Equipment:   Intra-op Plan:   Post-operative Plan: Extubation in OR  Informed Consent: I have reviewed the patients History and Physical, chart, labs and discussed the procedure including the risks, benefits and alternatives for the proposed anesthesia with the patient or authorized representative who has indicated his/her understanding and acceptance.   Dental advisory given  Plan Discussed with: Anesthesiologist, Surgeon and CRNA  Anesthesia Plan Comments:        Anesthesia Quick Evaluation

## 2014-04-18 NOTE — Anesthesia Postprocedure Evaluation (Signed)
  Anesthesia Post-op Note  Patient: Derek Guzman  Procedure(s) Performed: Procedure(s) with comments: IRRIGATION AND DEBRIDEMENT EXTREMITY (Right) - Right Ankle Manipulation Under Anesthesia, Apply Theraskin Graft  Patient Location: PACU  Anesthesia Type:General  Level of Consciousness: awake and sedated  Airway and Oxygen Therapy: Patient Spontanous Breathing  Post-op Pain: mild  Post-op Assessment: Post-op Vital signs reviewed  Post-op Vital Signs: stable  Last Vitals:  Filed Vitals:   04/18/14 1530  BP: 132/76  Pulse: 65  Temp:   Resp: 15    Complications: No apparent anesthesia complications

## 2014-04-18 NOTE — Op Note (Signed)
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 Application of allograft skin graft 2 x 3".  SURGEON:  Nadara MustardUDA,Mykel Sponaugle V, MD  PHYSICIAN ASSISTANT:None ANESTHESIA:   General  PREOPERATIVE INDICATIONS:  Derek Guzman is a  38 y.o. 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    The risks benefits and alternatives were discussed with the patient preoperatively including but not limited to the risks of infection, bleeding, nerve injury, cardiopulmonary complications, the need for revision surgery, among others, and the patient was willing to proceed.  OPERATIVE IMPLANTS: Allograft skin graft 2 x 3"  OPERATIVE FINDINGS: Good granulation tissue  OPERATIVE PROCEDURE: Patient was brought to the operating room and underwent a general anesthetic. After adequate levels and anesthesia obtained patient's right lower extremity was first prepped using Betadine scrub dried then prepped using Betadine paint and draped into a sterile field. A timeout was called. A 10 blade knife was used to remove the hyper granulation tissue from the lateral skin incision this had good beefy granulation tissue at the base of the wound measures 10 x 15 mm. The lateral necrotic wound from the crush injury the lateral aspect of the heel was debrided of skin and soft tissue 10 blade knife back to bleeding viable granulation tissue. Both wounds were irrigated with saline. The allograft skin graft was then applied to all the wounds. This was stapled in place. A Mepitel dressing was applied 4 x 4's ABDs Kerlix and Coban. Patient was extubated taken to the PACU in stable condition plan for discharge to home.

## 2014-04-21 ENCOUNTER — Encounter (HOSPITAL_COMMUNITY): Payer: Self-pay | Admitting: Orthopedic Surgery

## 2014-08-20 ENCOUNTER — Encounter (HOSPITAL_COMMUNITY): Payer: Self-pay | Admitting: Emergency Medicine

## 2014-08-20 ENCOUNTER — Emergency Department (HOSPITAL_COMMUNITY)
Admission: EM | Admit: 2014-08-20 | Discharge: 2014-08-20 | Disposition: A | Discharge status: Home / Self Care | Payer: Self-pay | Attending: Emergency Medicine | Admitting: Emergency Medicine

## 2014-08-20 DIAGNOSIS — M25571 Pain in right ankle and joints of right foot: Secondary | ICD-10-CM | POA: Insufficient documentation

## 2014-08-20 DIAGNOSIS — G8929 Other chronic pain: Secondary | ICD-10-CM | POA: Insufficient documentation

## 2014-08-20 DIAGNOSIS — Z7982 Long term (current) use of aspirin: Secondary | ICD-10-CM | POA: Insufficient documentation

## 2014-08-20 DIAGNOSIS — Z8781 Personal history of (healed) traumatic fracture: Secondary | ICD-10-CM | POA: Insufficient documentation

## 2014-08-20 DIAGNOSIS — G8918 Other acute postprocedural pain: Secondary | ICD-10-CM | POA: Insufficient documentation

## 2014-08-20 DIAGNOSIS — Z79899 Other long term (current) drug therapy: Secondary | ICD-10-CM | POA: Insufficient documentation

## 2014-08-20 DIAGNOSIS — M25471 Effusion, right ankle: Secondary | ICD-10-CM | POA: Insufficient documentation

## 2014-08-20 MED ORDER — NAPROXEN 500 MG PO TABS
500.0000 mg | ORAL_TABLET | Freq: Two times a day (BID) | ORAL | Status: DC | PRN
Start: 2014-08-20 — End: 2024-04-16

## 2014-08-20 MED ORDER — HYDROCODONE-ACETAMINOPHEN 5-325 MG PO TABS: 1.0000 | ORAL_TABLET | Freq: Four times a day (QID) | ORAL | Status: DC | PRN

## 2014-08-20 MED ORDER — HYDROCODONE-ACETAMINOPHEN 5-325 MG PO TABS
1.0000 | ORAL_TABLET | Freq: Once | ORAL | Status: AC
Start: 2014-08-20 — End: 2014-08-20
  Administered 2014-08-20: 1 via ORAL
  Filled 2014-08-20: qty 1

## 2014-08-20 NOTE — ED Notes (Signed)
Pt. reports pain / swelling at right lower leg and right foot onset today , denies injury , history of surgery at right foot last July 2015.

## 2014-08-20 NOTE — ED Notes (Signed)
Pts vitals updated pt awaiting discharge paper work at bedside  

## 2014-08-20 NOTE — Discharge Instructions (Signed)
Use ice or heat and elevate ankle throughout the day. Alternate between naprosyn and vicodin for pain relief. Do not drive or operate machinery with pain medication use. Call Dr. Lajoyce Cornersuda tomorrow to discuss pain control options and further management of your ankle pain. Return to the ER for changes or worsening symptoms.    Artralgia  (Arthralgia)  La artralgia es el dolor en las articulaciones. La articulacin es Immunologistel lugar en que se Lennar Corporationunen dos huesos. El dolor articular puede ocurrir por diversos motivos. La articulacin puede tener un hematoma, una infeccin, volverse rgida o debilitarse por la edad. El dolor generalmente desaparece despus de hacer reposo y tomar medicamentos para Primary school teachercalmar el dolor.  CUIDADOS EN EL HOGAR   Haga descansar la articulacin tal como le indic el mdico.  Mantenga arriba (elevada) la articulacin dolorida durante las primeras 24 horas.  Aplique hielo sobre la zona.  Ponga el hielo en una bolsa plstica.  Colquese una toalla entre la piel y la bolsa de hielo.  Deje el hielo durante 15 a 20 minutos, 3 a 4 veces por da.  Use un cabestrillo, un yeso o venda elstica segn las indicaciones.  Tome los medicamentos segn le indique el mdico. No tome aspirina.  Utilice las EchoStarmuletas como le haya indicado el mdico. No aplique el peso sobre la articulacin hasta que el mdico lo autorice. SOLICITE AYUDA DE INMEDIATO SI:   Tiene un hematoma, inflamacin (hinchazn) o aumenta el dolor.  Los dedos de las manos o de los pies estn azules o comienza a perder la sensibilidad (adormecimiento).  Los medicamentos no Editor, commissioningle calman el dolor.  El dolor se hace ms intenso.  La temperatura oral le sube a ms de 38,9 C (102 F), y no puede bajarla con medicamentos.  No puede mover o Counselling psychologistusar la articulacin. ASEGRESE DE QUE:   Comprende estas instrucciones.  Controlar su enfermedad.  Solicitar ayuda de inmediato si no mejora o si empeora. Document Released: 08/18/2011  Document Revised: 11/21/2011 [facility] Patient Information 2015 [place], [place]. This information is not intended to replace advice given to you by your health care provider. Make sure you discuss any questions you have with your health care provider.  Terapia con calor (Heat Therapy) La terapia con calor puede ayudar a aliviar articulaciones y msculos doloridos, lesionados, tensos y rgidos. El calor Mirantrelaja los msculos, lo cual puede ayudar a Engineer, materialsaliviar el dolor.  RIESGOS Y COMPLICACIONES Si tiene cualquiera de los [address] South Third Streetsiguientes problemas, no utilice la terapia con calor a menos que su mdico lo haya autorizado:  Mala circulacin.  Heridas que se estn curando o piel con cicatrices en la zona a tratar.  Diabetes, enfermedades cardacas o hipertensin arterial.  Incapacidad de sentir (entumecimiento) la zona tratada.  Hinchazn inusual de la zona a tratar.  Infecciones activas.  Cogulos sanguneos.  Cncer.  Incapacidad de Marketing executivecomunicar dolor. Esto puede incluir nios pequeos y personas que tienen problemas con la funcin cerebral (demencia).  Embarazo. La terapia con calor solo se debe usar en lesiones viejas, preexistentes o de larga duracin (crnicas). No utilice la terapia con calor en lesiones nuevas a menos que el mdico se lo indique. CMO USAR LA TERAPIA CON CALOR Existen varios tipos distintos de terapia con calor, como:  Compresas hmedas calientes.  Bao de agua caliente.  Bolsa de agua caliente.  Almohadilla trmica.  Bolsa de gel caliente.  Vendaje caliente.  Almohadilla trmica. Utilice el mtodo de terapia con calor que le sugiera su mdico. Siga las indicaciones del  mdico sobre cmo y cundo usar la terapia con Airline pilotcalor. RECOMENDACIONES GENERALES PARA LA TERAPIA CON CALOR  No duerma mientras Botswanausa la terapia con calor. Utilice la terapia con calor solo mientras est despierto.  La piel puede volverse rosada mientras Botswanausa la terapia con calor. No use la terapia  con calor si la piel se pone roja.  No use la terapia con calor si siente un dolor nuevo.  Una temperatura muy alta o una exposicin prolongada al calor puede causar quemaduras. Sea cauto con la terapia de calor para evitar quemar la piel.  No use la terapia con calor en zonas de la piel que ya estn irritadas, como con una erupcin o una quemadura de sol. SOLICITE ATENCIN MDICA SI:  Observa ampollas, enrojecimiento, hinchazn o adormecimiento.  Siente un dolor nuevo.  El dolor San Rafaelempeora. ASEGRESE DE QUE:  Comprende estas instrucciones.  Controlar su afeccin.  Recibir ayuda de inmediato si no mejora o si empeora. Document Released: 11/21/2011 Document Revised: 01/13/2014 [facility] Patient Information 2015 [place], [place]. This information is not intended to replace advice given to you by your health care provider. Make sure you discuss any questions you have with your health care provider.
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
## 2014-08-20 NOTE — ED Notes (Signed)
Declined W/C at D/C and was escorted to lobby by RN. 

## 2014-08-22 ENCOUNTER — Ambulatory Visit: Payer: Self-pay | Attending: Internal Medicine

## 2014-09-17 ENCOUNTER — Ambulatory Visit: Payer: Self-pay | Admitting: Internal Medicine

## 2014-10-15 ENCOUNTER — Encounter: Payer: Self-pay | Admitting: Internal Medicine

## 2014-10-15 ENCOUNTER — Ambulatory Visit: Payer: Self-pay | Attending: Internal Medicine | Admitting: Internal Medicine

## 2014-10-15 VITALS — BP 132/84 | HR 66 | Temp 98.0°F | Resp 16 | Wt 237.0 lb

## 2014-10-15 DIAGNOSIS — Z23 Encounter for immunization: Secondary | ICD-10-CM

## 2014-10-15 DIAGNOSIS — Z0000 Encounter for general adult medical examination without abnormal findings: Secondary | ICD-10-CM

## 2014-10-15 DIAGNOSIS — K088 Other specified disorders of teeth and supporting structures: Secondary | ICD-10-CM

## 2014-10-15 DIAGNOSIS — K0889 Other specified disorders of teeth and supporting structures: Secondary | ICD-10-CM

## 2014-10-15 DIAGNOSIS — L739 Follicular disorder, unspecified: Secondary | ICD-10-CM

## 2014-10-15 DIAGNOSIS — K029 Dental caries, unspecified: Secondary | ICD-10-CM

## 2014-10-15 LAB — CBC WITH DIFFERENTIAL/PLATELET
Basophils Absolute: 0 10*3/uL (ref 0.0–0.1)
Basophils Relative: 0 % (ref 0–1)
EOS ABS: 0.3 10*3/uL (ref 0.0–0.7)
Eosinophils Relative: 3 % (ref 0–5)
HEMATOCRIT: 42.6 % (ref 39.0–52.0)
Hemoglobin: 14.6 g/dL (ref 13.0–17.0)
Lymphocytes Relative: 44 % (ref 12–46)
Lymphs Abs: 3.7 10*3/uL (ref 0.7–4.0)
MCH: 31.9 pg (ref 26.0–34.0)
MCHC: 34.3 g/dL (ref 30.0–36.0)
MCV: 93.2 fL (ref 78.0–100.0)
MONO ABS: 0.7 10*3/uL (ref 0.1–1.0)
MPV: 10.6 fL (ref 8.6–12.4)
Monocytes Relative: 8 % (ref 3–12)
NEUTROS PCT: 45 % (ref 43–77)
Neutro Abs: 3.8 10*3/uL (ref 1.7–7.7)
PLATELETS: 220 10*3/uL (ref 150–400)
RBC: 4.57 MIL/uL (ref 4.22–5.81)
RDW: 14.5 % (ref 11.5–15.5)
WBC: 8.4 10*3/uL (ref 4.0–10.5)

## 2014-10-15 LAB — COMPLETE METABOLIC PANEL WITH GFR
ALK PHOS: 100 U/L (ref 39–117)
ALT: 31 U/L (ref 0–53)
AST: 19 U/L (ref 0–37)
Albumin: 4 g/dL (ref 3.5–5.2)
BILIRUBIN TOTAL: 0.7 mg/dL (ref 0.2–1.2)
BUN: 13 mg/dL (ref 6–23)
CHLORIDE: 104 meq/L (ref 96–112)
CO2: 24 mEq/L (ref 19–32)
Calcium: 9.2 mg/dL (ref 8.4–10.5)
Creat: 0.74 mg/dL (ref 0.50–1.35)
GFR, Est African American: >89 mL/min
Glucose, Bld: 99 mg/dL (ref 70–99)
Potassium: 4.4 mEq/L (ref 3.5–5.3)
Sodium: 141 mEq/L (ref 135–145)
TOTAL PROTEIN: 7.1 g/dL (ref 6.0–8.3)

## 2014-10-15 LAB — HEMOGLOBIN A1C
HEMOGLOBIN A1C: 6.2 % — AB (ref <5.7)
MEAN PLASMA GLUCOSE: 131 mg/dL — AB (ref <117)

## 2014-10-15 MED ORDER — AMOXICILLIN-POT CLAVULANATE 875-125 MG PO TABS: 1.0000 | ORAL_TABLET | Freq: Two times a day (BID) | ORAL | Status: DC

## 2014-10-15 MED ORDER — IBUPROFEN 800 MG PO TABS: 800.0000 mg | ORAL_TABLET | Freq: Three times a day (TID) | ORAL | Status: DC | PRN

## 2014-10-15 NOTE — Progress Notes (Signed)
Patient Demographics  Derek Guzman, is a 39 y.o. male  ZOX:[id number]  WJX:[id number]  DOB - [birth year]  CC:  Chief Complaint  Patient presents with  . Annual Exam       HPI: Derek Guzman is a 39 y.o. male here today for annual physical examination.patient has history of dental caries as well as folliculitis especially on the back of neck, EMR reviewed patient has been referred in the past to dermatology as well as dentist, since has orange card at bedtime expired and could not see the specialist, currently has been having dental pain does complain of occasional fever chills but denies any currently.patient will like to get a flu shot today. Patient has No headache, No chest pain, No abdominal pain - No Nausea, No new weakness tingling or numbness, No Cough - SOB.  No Known Allergies Past Medical History  Diagnosis Date  . Syncopal episodes   . Complication of anesthesia   . PONV (postoperative nausea and vomiting)     one day after surgery had vomiting   Current Outpatient Prescriptions on File Prior to Visit  Medication Sig Dispense Refill  . aspirin EC 81 MG tablet Take 1 tablet (81 mg total) by mouth daily. 30 tablet 1  . HYDROcodone-acetaminophen (NORCO) 5-325 MG per tablet Take 1-2 tablets by mouth every 6 (six) hours as needed for severe pain. 10 tablet 0  . naproxen (NAPROSYN) 500 MG tablet Take 1 tablet (500 mg total) by mouth 2 (two) times daily as needed for mild pain, moderate pain or headache (TAKE WITH MEALS.). 20 tablet 0  . oxyCODONE-acetaminophen (ROXICET) 5-325 MG per tablet Take 1 tablet by mouth every 4 (four) hours as needed for severe pain. 60 tablet 0  . sulfamethoxazole-trimethoprim (BACTRIM DS) 800-160 MG per tablet Take 1 tablet by mouth 2 (two) times daily. For 14 days     No current facility-administered medications on file prior to visit.   History reviewed. No pertinent family history. History   Social History  . Marital Status:  Married    Spouse Name: N/A    Number of Children: 2  . Years of Education: 11   Occupational History  . concrete     Social History Main Topics  . Smoking status: Never Smoker   . Smokeless tobacco: Never Used  . Alcohol Use: Yes     Comment: just on his birthday  . Drug Use: No  . Sexual Activity: Not on file   Other Topics Concern  . Not on file   Social History Narrative    Review of Systems: Constitutional: Negative for fever, chills, diaphoresis, activity change, appetite change and fatigue. HENT: Negative for ear pain, nosebleeds, congestion, facial swelling, rhinorrhea, neck pain, neck stiffness and ear discharge.  Eyes: Negative for pain, discharge, redness, itching and visual disturbance. Respiratory: Negative for cough, choking, chest tightness, shortness of breath, wheezing and stridor.  Cardiovascular: Negative for chest pain, palpitations and leg swelling. Gastrointestinal: Negative for abdominal distention. Genitourinary: Negative for dysuria, urgency, frequency, hematuria, flank pain, decreased urine volume, difficulty urinating and dyspareunia.  Musculoskeletal: Negative for back pain, joint swelling, arthralgia and gait problem. Neurological: Negative for dizziness, tremors, seizures, syncope, facial asymmetry, speech difficulty, weakness, light-headedness, numbness and headaches.  Hematological: Negative for adenopathy. Does not bruise/bleed easily. Psychiatric/Behavioral: Negative for hallucinations, behavioral problems, confusion, dysphoric mood, decreased concentration and agitation.    Objective:   Filed Vitals:   10/15/14 1521  BP: 132/84  Pulse: 66  Temp: 98 F (36.7 C)  Resp: 16    Physical Exam: Constitutional: Patient appears well-developed and well-nourished. No distress. HENT: Normocephalic, atraumatic, External right and left ear normal. Oropharynx is clear and moist.  Eyes: Conjunctivae and EOM are normal. PERRLA, no scleral  icterus. Neck: Normal ROM. Neck supple. No JVD. No tracheal deviation. No thyromegaly. CVS: RRR, S1/S2 +, no murmurs, no gallops, no carotid bruit.  Pulmonary: Effort and breath sounds normal, no stridor, rhonchi, wheezes, rales.  Abdominal: Soft. BS +, no distension, tenderness, rebound or guarding.  Musculoskeletal: Normal range of motion. No edema and no tenderness.  Lymphadenopathy: No lymphadenopathy noted, cervical, inguinal or axillary Neuro: Alert. Normal reflexes, muscle tone coordination. No cranial nerve deficit. Skin: Skin is warm and dry. Rash noticed on the back of neck Psychiatric: Normal mood and affect. Behavior, judgment, thought content normal.  Lab Results  Component Value Date   WBC 9.1 04/18/2014   HGB 12.8* 04/18/2014   HCT 38.5* 04/18/2014   MCV 95.3 04/18/2014   PLT 230 04/18/2014   Lab Results  Component Value Date   CREATININE 0.80 04/18/2014   BUN 16 04/18/2014   NA 143 04/18/2014   K 4.3 04/18/2014   CL 105 04/18/2014   CO2 26 04/18/2014    Lab Results  Component Value Date   HGBA1C 5.8 01/10/2014   Lipid Panel     Component Value Date/Time   CHOL 185 03/21/2013 1101   TRIG 233* 03/21/2013 1101   HDL 45 03/21/2013 1101   CHOLHDL 4.1 03/21/2013 1101   VLDL 47* 03/21/2013 1101   LDLCALC 93 03/21/2013 1101       Assessment and plan:   1. Annual physical exam ordered baseline blood work. - CBC with Differential/Platelet - COMPLETE METABOLIC PANEL WITH GFR - Hemoglobin A1c - Vit D  25 hydroxy (rtn osteoporosis monitoring)  2. Tooth pain/3. Dental caries  - Ambulatory referral to Dentistry - amoxicillin-clavulanate (AUGMENTIN) 875-125 MG per tablet; Take 1 tablet by mouth 2 (two) times daily.  Dispense: 20 tablet; Refill: 0 - ibuprofen (ADVIL,MOTRIN) 800 MG tablet; Take 1 tablet (800 mg total) by mouth every 8 (eight) hours as needed.  Dispense: 30 tablet; Refill: 1   4. Needs flu shot Flu shot given today.  5. Folliculitis  -  amoxicillin-clavulanate (AUGMENTIN) 875-125 MG per tablet; Take 1 tablet by mouth 2 (two) times daily.  Dispense: 20 tablet; Refill: 0 - Ambulatory referral to Dermatology     Health Maintenance  -Vaccinations:  Flu shot given today.  Return in about 1 year (around 10/16/2015), or if symptoms worsen or fail to improve.   Doris CheadleADVANI, Reya Aurich, MD

## 2014-10-15 NOTE — Progress Notes (Signed)
Patient here with an interpreter  Here for his annual physical Complains of rash to the nape of his neck

## 2014-10-16 LAB — VITAMIN D 25 HYDROXY (VIT D DEFICIENCY, FRACTURES): VIT D 25 HYDROXY: 14 ng/mL — AB (ref 30–100)

## 2014-10-17 ENCOUNTER — Telehealth: Payer: Self-pay | Admitting: *Deleted

## 2014-10-17 DIAGNOSIS — L739 Follicular disorder, unspecified: Secondary | ICD-10-CM

## 2014-10-17 DIAGNOSIS — K0889 Other specified disorders of teeth and supporting structures: Secondary | ICD-10-CM

## 2014-10-17 MED ORDER — VITAMIN D (ERGOCALCIFEROL) 1.25 MG (50000 UNIT) PO CAPS
50000.0000 [IU] | ORAL_CAPSULE | ORAL | Status: DC
Start: 2014-10-17 — End: 2024-04-16

## 2014-10-17 NOTE — Telephone Encounter (Signed)
Rx send to [facility]    Notes Recorded by Doris Cheadleeepak Advani, MD on 10/16/2014 at 9:09 AM Blood work reviewed noticed hemoglobin A1c of 6.2%, patient has prediabetes, call and advise patient for low carbohydrate diet. noticed low vitamin D, call patient advise to start ergocalciferol 50,000 units once a week for the duration of 12 weeks.

## 2014-10-17 NOTE — Telephone Encounter (Signed)
LVM to return call (VM left in spanish)    Notes Recorded by Doris Cheadleeepak Advani, MD on 10/16/2014 at 9:09 AM Blood work reviewed noticed hemoglobin A1c of 6.2%, patient has prediabetes, call and advise patient for low carbohydrate diet. noticed low vitamin D, call patient advise to start ergocalciferol 50,000 units once a week for the duration of 12 weeks.

## 2014-10-22 ENCOUNTER — Telehealth: Payer: Self-pay | Admitting: Internal Medicine

## 2014-10-22 NOTE — Telephone Encounter (Signed)
LVM

## 2014-10-22 NOTE — Telephone Encounter (Signed)
Patient is returning call for nurse about his results, please f/u with pt.
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
## 2016-05-21 IMAGING — CR DG ANKLE COMPLETE 3+V*R*
3 series · 3 of 3 positions shown · non-contrast
Comparison: 03/30/2014

CLINICAL DATA: Recent ankle injury, history of prior fracture with
fixation

EXAM:
RIGHT ANKLE - COMPLETE 3+ VIEW

[x ankle ap right]
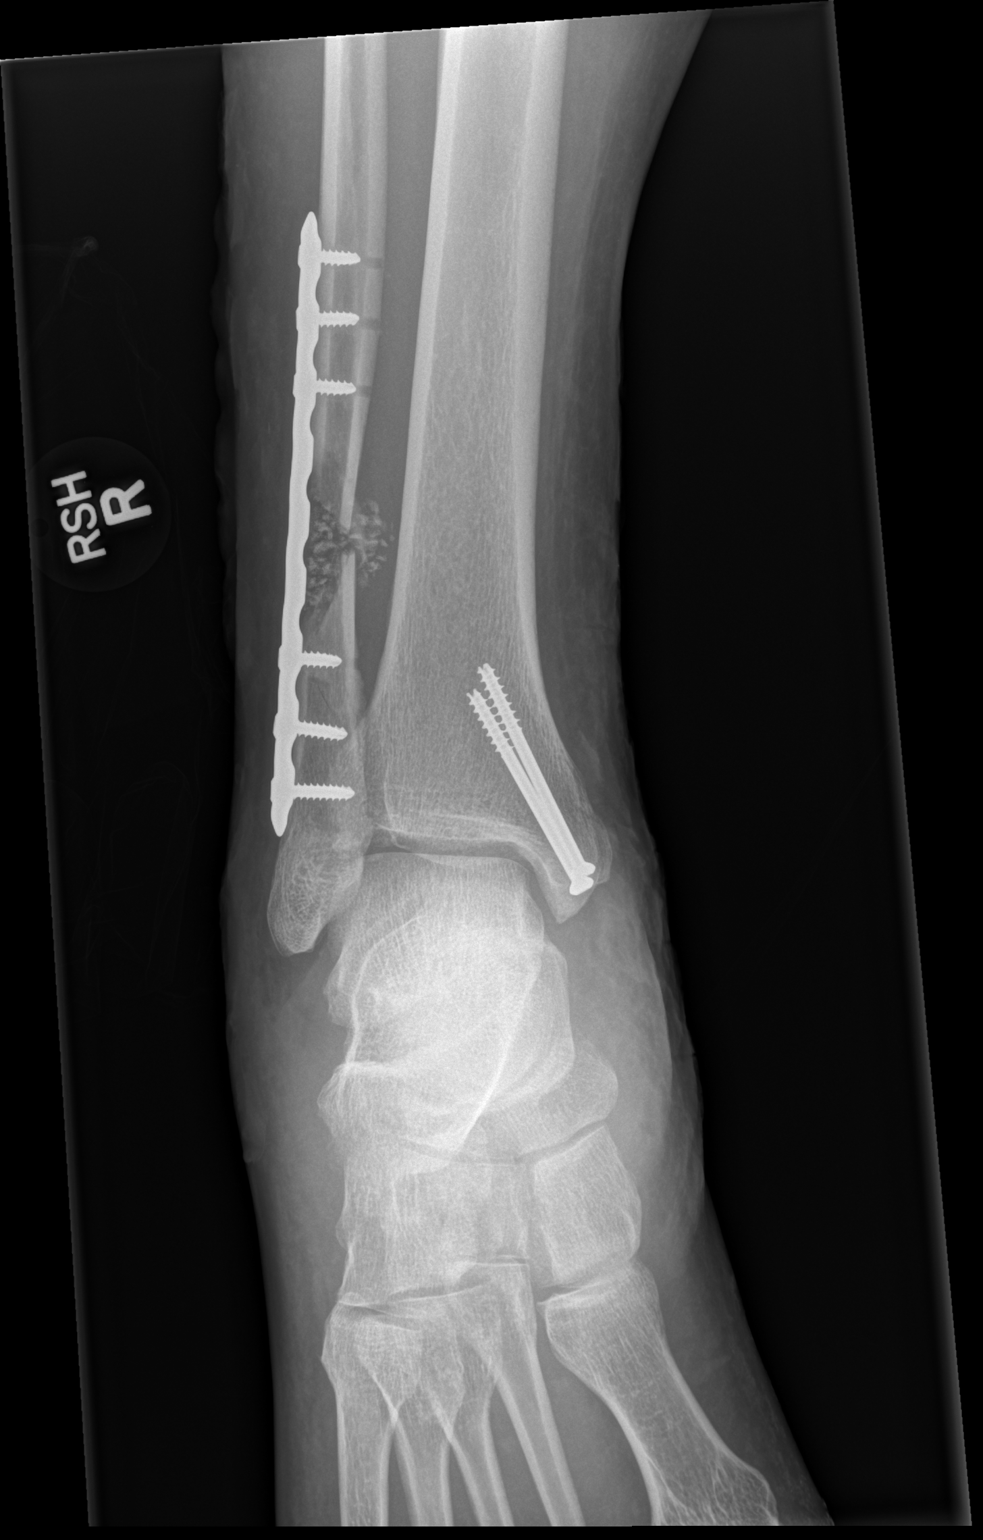

[x ankle obl right]
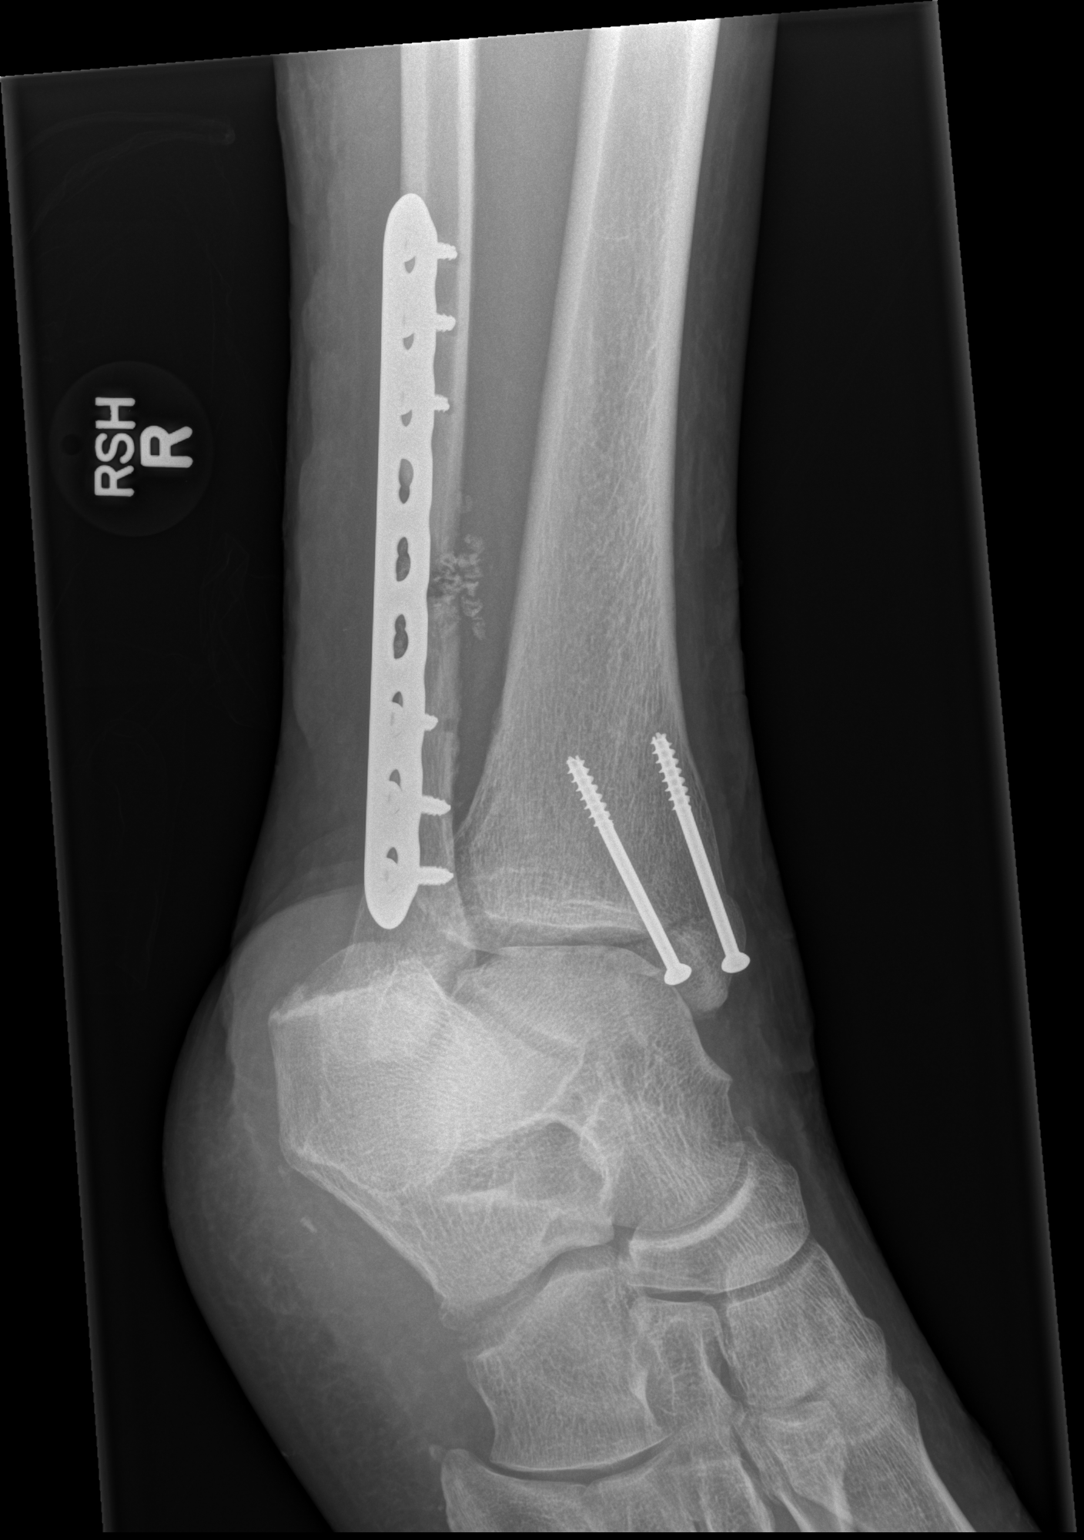

[x ankle lat right]
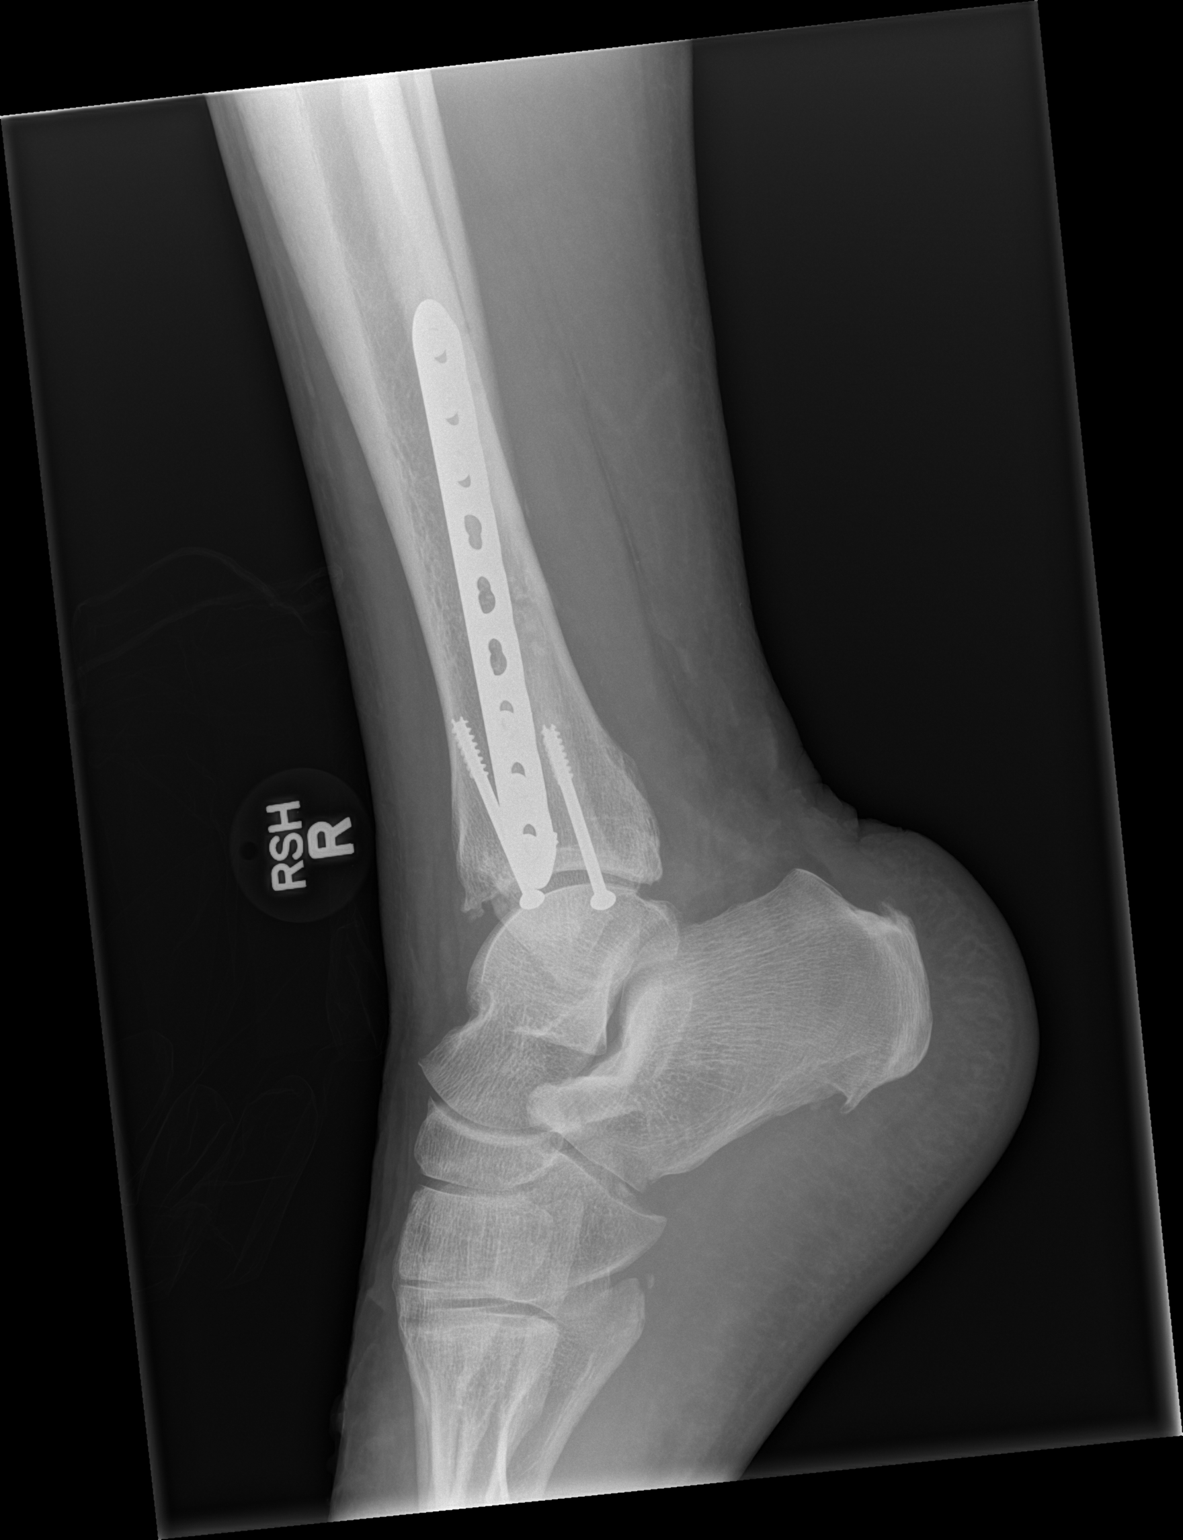

[3 of 3 positions shown; findings below may reference images not displayed]

FINDINGS: Postsurgical changes are noted in the distal fibula and distal
medial malleolus. Fixation sideplate is noted along the fibula with
osteogenic material within the fracture site. The overall appearance
is stable from the prior exam. Avulsion fracture from the base of
the fifth metatarsal is again identified. No acute abnormality is
seen.
IMPRESSION: Stable surgically repaired distal fibular and tibial fractures.

## 2019-10-09 ENCOUNTER — Ambulatory Visit: Payer: Self-pay | Attending: Internal Medicine

## 2019-10-09 DIAGNOSIS — U071 COVID-19: Secondary | ICD-10-CM | POA: Insufficient documentation

## 2019-10-09 DIAGNOSIS — Z20822 Contact with and (suspected) exposure to covid-19: Secondary | ICD-10-CM

## 2019-10-11 LAB — NOVEL CORONAVIRUS, NAA: SARS-CoV-2, NAA: DETECTED — AB

## 2024-04-16 ENCOUNTER — Encounter: Payer: Self-pay | Admitting: Physician Assistant

## 2024-04-16 ENCOUNTER — Ambulatory Visit: Payer: Self-pay | Admitting: Physician Assistant

## 2024-04-16 VITALS — BP 116/78 | HR 90 | Ht 66.5 in | Wt 213.0 lb

## 2024-04-16 DIAGNOSIS — Z13220 Encounter for screening for lipoid disorders: Secondary | ICD-10-CM

## 2024-04-16 DIAGNOSIS — M545 Low back pain, unspecified: Secondary | ICD-10-CM

## 2024-04-16 DIAGNOSIS — G8929 Other chronic pain: Secondary | ICD-10-CM

## 2024-04-16 DIAGNOSIS — E559 Vitamin D deficiency, unspecified: Secondary | ICD-10-CM

## 2024-04-16 DIAGNOSIS — E1165 Type 2 diabetes mellitus with hyperglycemia: Secondary | ICD-10-CM

## 2024-04-16 DIAGNOSIS — R7303 Prediabetes: Secondary | ICD-10-CM

## 2024-04-16 DIAGNOSIS — R82998 Other abnormal findings in urine: Secondary | ICD-10-CM

## 2024-04-16 DIAGNOSIS — R8290 Unspecified abnormal findings in urine: Secondary | ICD-10-CM

## 2024-04-16 LAB — POCT URINALYSIS DIP (CLINITEK)
Bilirubin, UA: NEGATIVE
Blood, UA: NEGATIVE
Glucose, UA: 500 mg/dL — AB
Ketones, POC UA: NEGATIVE mg/dL
Leukocytes, UA: NEGATIVE
Nitrite, UA: NEGATIVE
POC PROTEIN,UA: NEGATIVE
Spec Grav, UA: 1.02 (ref 1.010–1.025)
Urobilinogen, UA: 0.2 U/dL
pH, UA: 7 (ref 5.0–8.0)

## 2024-04-16 LAB — POCT GLYCOSYLATED HEMOGLOBIN (HGB A1C): Hemoglobin A1C: 10.3 % — AB (ref 4.0–5.6)

## 2024-04-16 MED ORDER — METFORMIN HCL ER 500 MG PO TB24: ORAL_TABLET | ORAL | 1 refills | Status: DC

## 2024-04-16 MED ORDER — TRUEPLUS LANCETS 28G MISC: 1.0000 | Freq: Two times a day (BID) | 11 refills | Status: AC

## 2024-04-16 MED ORDER — TRUE METRIX BLOOD GLUCOSE TEST VI STRP: 1.0000 | ORAL_STRIP | Freq: Two times a day (BID) | 12 refills | Status: AC

## 2024-04-16 MED ORDER — CYCLOBENZAPRINE HCL 10 MG PO TABS: 10.0000 mg | ORAL_TABLET | Freq: Three times a day (TID) | ORAL | 0 refills | Status: DC | PRN

## 2024-04-16 MED ORDER — TRUE METRIX METER W/DEVICE KIT: 1.0000 [IU] | PACK | Freq: Once | 0 refills | Status: AC

## 2024-04-16 MED ORDER — IBUPROFEN 600 MG PO TABS: 600.0000 mg | ORAL_TABLET | Freq: Three times a day (TID) | ORAL | 0 refills | Status: AC | PRN

## 2024-04-16 NOTE — Progress Notes (Unsigned)
 New Patient Office Visit  Subjective    Patient ID: Derek Guzman, male    DOB: 1976-03-22  Age: 48 y.o. MRN: [id number]  CC:  Chief Complaint  Patient presents with   Annual Exam    Patient has noticed that urine Is foamy for the past few months, with a slight smell.    Fatigue        Back Pain    Lower back pain, symptoms for 8 days    Discussed the use of AI scribe software for clinical note transcription with the patient, who gave verbal consent to proceed.  History of Present Illness   Derek Guzman is a 48 year old male with type 2 diabetes who presents with foamy urine and fatigue.  He experiences foamy, very yellow urine with a noticeable odor for the past few months. Drinking water temporarily clears the symptoms, but he returns. There is no burning sensation or discharge with urination.  He feels fatigued and more tired than usual for the past two weeks, without changes in sleep patterns.  He has low back pain for approximately three months, localized and non-radiating. He works in [employer] representative and does not recall any specific injury. Massages provide temporary relief, but he has not used medications like Tylenol  or ibuprofen .  He works in [employer] representative and denies alcohol and tobacco use.  Due to language barrier, an interpreter was present during the history-taking and subsequent discussion (and for part of the physical exam) with this patient.      Outpatient Encounter Medications as of 04/16/2024  Medication Sig   [EXPIRED] Blood Glucose Monitoring Suppl (TRUE METRIX METER) w/Device KIT 1 Units by Does not apply route once for 1 dose.   cyclobenzaprine  (FLEXERIL ) 10 MG tablet Take 1 tablet (10 mg total) by mouth 3 (three) times daily as needed for muscle spasms.   glucose blood (TRUE METRIX BLOOD GLUCOSE TEST) test strip 1 each by Other route 2 (two) times daily. Use as instructed   ibuprofen  (ADVIL ) 600 MG tablet Take 1 tablet (600 mg total) by mouth  every 8 (eight) hours as needed.   metFORMIN  (GLUCOPHAGE -XR) 500 MG 24 hr tablet Take 1 tab PO with breakfast for one week then take BID   TRUEplus Lancets 28G MISC 1 each by Does not apply route 2 (two) times daily at 8 am and 10 pm.   [DISCONTINUED] amoxicillin -clavulanate (AUGMENTIN ) 875-125 MG per tablet Take 1 tablet by mouth 2 (two) times daily.   [DISCONTINUED] aspirin  EC 81 MG tablet Take 1 tablet (81 mg total) by mouth daily.   [DISCONTINUED] HYDROcodone -acetaminophen  (NORCO) 5-325 MG per tablet Take 1-2 tablets by mouth every 6 (six) hours as needed for severe pain.   [DISCONTINUED] ibuprofen  (ADVIL ,MOTRIN ) 800 MG tablet Take 1 tablet (800 mg total) by mouth every 8 (eight) hours as needed.   [DISCONTINUED] naproxen  (NAPROSYN ) 500 MG tablet Take 1 tablet (500 mg total) by mouth 2 (two) times daily as needed for mild pain, moderate pain or headache (TAKE WITH MEALS.).   [DISCONTINUED] oxyCODONE -acetaminophen  (ROXICET) 5-325 MG per tablet Take 1 tablet by mouth every 4 (four) hours as needed for severe pain.   [DISCONTINUED] sulfamethoxazole -trimethoprim  (BACTRIM  DS) 800-160 MG per tablet Take 1 tablet by mouth 2 (two) times daily. For 14 days   [DISCONTINUED] Vitamin D , Ergocalciferol , (DRISDOL ) 50000 UNITS CAPS capsule Take 1 capsule (50,000 Units total) by mouth every 7 (seven) days.   No facility-administered encounter medications on file as of 04/16/2024.  Past Medical History:  Diagnosis Date   Complication of anesthesia    PONV (postoperative nausea and vomiting)    one day after surgery had vomiting   Syncopal episodes     Past Surgical History:  Procedure Laterality Date   APPLICATION OF WOUND VAC Right 03/18/2014   Procedure: APPLICATION OF WOUND VAC;  Surgeon: Jerona Harden GAILS, MD;  Location: [facility] OR;  Service: Orthopedics;  Laterality: Right;   dental fillings     FASCIOTOMY Right 03/18/2014   Procedure: FASCIOTOMY;  Surgeon: Jerona Harden GAILS, MD;  [facility];  Service:  Orthopedics;  Laterality: Right;   I & D EXTREMITY Right 04/18/2014   Procedure: IRRIGATION AND DEBRIDEMENT EXTREMITY;  Surgeon: Jerona Harden GAILS, MD;  Location: [facility] OR;  Service: Orthopedics;  Laterality: Right;  Right Ankle Manipulation Under Anesthesia, Apply Theraskin Graft   ORIF ANKLE FRACTURE Right 03/18/2014   Procedure: OPEN REDUCTION INTERNAL FIXATION (ORIF) ANKLE FRACTURE;  Surgeon: Jerona Harden GAILS, MD;  Location: [facility] OR;  Service: Orthopedics;  Laterality: Right;    History reviewed. No pertinent family history.  Social History   Socioeconomic History   Marital status: Married    Spouse name: Not on file   Number of children: 2   Years of education: 11   Highest education level: Not on file  Occupational History   Occupation: concrete   Tobacco Use   Smoking status: Never   Smokeless tobacco: Never  Vaping Use   Vaping status: Never Used  Substance and Sexual Activity   Alcohol use: Yes    Comment: just on his birthday   Drug use: No   Sexual activity: Not on file  Other Topics Concern   Not on file  Social History Narrative   Not on file   Social Drivers of Health   Financial Resource Strain: Not on file  Food Insecurity: Not on file  Transportation Needs: Not on file  Physical Activity: Not on file  Stress: Not on file  Social Connections: Not on file  Intimate Partner Violence: Not on file    Review of Systems  Constitutional:  Negative for chills and fever.  HENT: Negative.    Eyes: Negative.   Respiratory:  Negative for shortness of breath.   Cardiovascular:  Negative for chest pain.  Gastrointestinal: Negative.   Genitourinary:  Positive for frequency. Negative for dysuria and hematuria.  Musculoskeletal:  Positive for back pain and myalgias.  Skin: Negative.   Neurological: Negative.   Endo/Heme/Allergies: Negative.   Psychiatric/Behavioral: Negative.          Objective    BP 116/78 (BP Location: Left Arm, Patient Position: Sitting, Cuff Size:  Large)   Pulse 90   Ht 5' 6.5 (1.689 m)   Wt 213 lb (96.6 kg)   SpO2 100%   BMI 33.86 kg/m   Physical Exam Vitals and nursing note reviewed.  Constitutional:      Appearance: Normal appearance.  HENT:     Head: Normocephalic and atraumatic.     Right Ear: External ear normal.     Left Ear: External ear normal.     Nose: Nose normal.     Mouth/Throat:     Mouth: Mucous membranes are moist.     Pharynx: Oropharynx is clear.  Eyes:     Extraocular Movements: Extraocular movements intact.     Conjunctiva/sclera: Conjunctivae normal.     Pupils: Pupils are equal, round, and reactive to light.  Cardiovascular:  Rate and Rhythm: Normal rate and regular rhythm.     Pulses: Normal pulses.     Heart sounds: Normal heart sounds.  Pulmonary:     Effort: Pulmonary effort is normal.     Breath sounds: Normal breath sounds.  Abdominal:     Tenderness: There is no right CVA tenderness or left CVA tenderness.  Musculoskeletal:     Cervical back: Normal, normal range of motion and neck supple.     Thoracic back: Normal.     Lumbar back: No tenderness. Decreased range of motion.     Comments: Slight pain elicited with ROM testing   Skin:    General: Skin is warm and dry.  Neurological:     General: No focal deficit present.     Mental Status: He is alert and oriented to person, place, and time.  Psychiatric:        Mood and Affect: Mood normal.        Behavior: Behavior normal.        Thought Content: Thought content normal.        Judgment: Judgment normal.       Assessment & Plan:   Problem List Items Addressed This Visit       Endocrine   Type 2 diabetes mellitus with hyperglycemia, without long-term current use of insulin (HCC) - Primary   Relevant Medications   metFORMIN  (GLUCOPHAGE -XR) 500 MG 24 hr tablet   glucose blood (TRUE METRIX BLOOD GLUCOSE TEST) test strip   TRUEplus Lancets 28G MISC   Other Relevant Orders   CBC with Differential/Platelet   Comp.  Metabolic Panel (12)   TSH   Microalbumin / creatinine urine ratio   HgB A1c (Completed)     Other   Vitamin D  deficiency   Relevant Orders   Vitamin D , 25-hydroxy   Other Visit Diagnoses       Acute midline low back pain without sciatica       Relevant Medications   ibuprofen  (ADVIL ) 600 MG tablet   cyclobenzaprine  (FLEXERIL ) 10 MG tablet     Foamy urine       Relevant Orders   PSA   POCT URINALYSIS DIP (CLINITEK) (Completed)     Screening, lipid          Assessment and Plan Type 2 diabetes mellitus, uncontrolled with proteinuria and fatigue Newly diagnosed , uncontrolled type 2 diabetes  - Order blood work for glucose, kidney, liver, thyroid function, and anemia. - Prescribe metformin  once daily for one week, then twice daily. - Instruct to avoid added sugars, especially in sweet tea. - Provide glucometer for home monitoring. - Instruct to check morning fasting blood glucose and record results. - Schedule follow-up in two weeks to review glucose levels and adjust treatment. 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 - Prescribe pain medication and muscle relaxant. - Advise regular stretching exercises. - Encourage increased water intake.   I have reviewed the patient's medical history (PMH, PSH, Social History, Family History, Medications, and allergies) , and have been updated if relevant. I spent 30 minutes reviewing chart and  face to face time with patient.   Return in about 2 weeks (around 04/30/2024) for With MMU.   Kirk RAMAN Mayers, PA-C

## 2024-04-16 NOTE — Patient Instructions (Addendum)
 VISIT SUMMARY:  Today, you were seen for foamy urine, fatigue, and back pain. We discussed your type 2 diabetes and its management, as well as your back pain and general health maintenance.  YOUR PLAN:  -TYPE 2 DIABETES MELLITUS, UNCONTROLLED WITH PROTEINURIA AND FATIGUE: Your type 2 diabetes is not well controlled, which is likely causing the foamy urine and fatigue. We will start you on metformin , a medication to help control your blood sugar. Begin with one dose daily for the first week, then increase to twice daily. Avoid added sugars, especially in sweet tea. You will also receive a glucometer to monitor your blood sugar levels at home. Please check your morning fasting blood glucose and record the results. We will review your glucose levels and adjust your treatment in two weeks.  -LOW BACK PAIN: Your back pain is likely related to your work in [employer]. We will prescribe pain medication and a muscle relaxant to help manage the pain. Regular stretching exercises and increased water intake are also recommended to help alleviate the discomfort.  Vivir con diabetes Living With Diabetes La diabetes (diabetes mellitus tipo 1 o diabetes mellitus tipo 2) es una afeccin en la que el [place], o bien no fabrica suficiente cantidad de una hormona llamada [place], o bien no responde a la insulina de forma correcta. La funcin de la insulina es trasladar los azcares (glucosa) a las clulas del cuerpo. En las personas que tienen diabetes, la glucosa adicional se acumula en la sangre en lugar de ir a las clulas. Esto da como resultado un aumento de la glucemia (hiperglucemia). Cmo manejar los cambios en el estilo de vida Para ayudar a controlar la diabetes, es posible que necesite tratamiento, como medicamentos. Tambin es posible que deba hacer cambios en su estilo de vida. Para cuidarse, debe hacer lo siguiente: Mdase la glucosa con frecuencia. Siga una dieta saludable. Haga ejercicio fsico  con frecuencia. Renase con sus mdicos. Use los medicamentos como se lo hayan indicado los mdicos. La [employer] de las personas sienten algo de estrs en lo que respecta al control de la diabetes. Cuando este estrs se vuelve abrumador, se conoce como angustia relacionada con la diabetes. Esto es muy frecuente. Vivir con diabetes tambin puede implicar un riesgo de padecer depresin o ansiedad. Estos trastornos pueden hacer que su afeccin sea ms difcil de Chief Operating Officer. Cmo reconocer el estrs Es posible que tenga angustia relacionada con la diabetes si usted: Evita encargarse de los cuidados diarios de la diabetes o los ignora. Los cuidados diarios pueden incluir medirse la glucosa, seguir un plan de comidas y usar medicamentos. Se siente abrumado por lo que tiene que hacer cada da para cuidarse. Se siente enojado, triste o temeroso cuando se trata de sus cuidados diarios. Siente miedo o vergenza por no hacer a Psychologist, clinical todo lo que le han dicho que haga. Estrs emocional Si tiene panama relacionada con la diabetes, puede sentir: Enfado por tener diabetes. [place] o frustracin sobre la afeccin y los cambios que debe hacer para Theatre manager. Mucha preocupacin por los cuidados que necesita o por el costo de [place]. Que usted provoc la afeccin por haber hecho algo mal. Temor a los cambios en su glucemia que quizs no pueda predecir. Que sus mdicos lo juzgan. Que est [employer] solo. Depresin Si tiene diabetes, corre un riesgo mayor de padecer depresin. El mdico podra evaluarlo (examinarlo) para detectar sntomas. Entre los sntomas, se pueden incluir los siguientes: Falta de inters por las cosas que antes disfrutaba. Sentirse  deprimido gran parte o la mayor parte del tiempo. Cambios en el apetito. Tener dificultades para dormirse o para Personal assistant dormido. Sentirse cansado la mayor parte del da. Sentirse nervioso y [employer]. Sentirse culpable y [employer] preocupe ser ignacia carga para los  dems. Tener pensamientos sobre lastimarse a usted mismo o sentir que quiere morir. Si tiene alguno de estos sntomas la [employer] de los [address] 2 semanas o ms, puede tener depresin. Este sera un buen momento para ponerse en contacto con el mdico. Cmo controlar la angustia relacionada con la diabetes Para controlar la angustia: Infrmese todo lo que pueda sobre la enfermedad y [place]. Vaya de un paso por vez para mejorar la manera de gestionar sus cuidados diarios. Renase con un experto capacitado en el cuidado de la diabetes (educador certificado en el cuidado de la diabetes). Tome una clase para aprender cmo controlar la enfermedad. Considere la posibilidad de trabajar con un asesor o un terapeuta. Lleve un diario donde anote sus pensamientos y sus preocupaciones. Acepte que no puede controlar todo. Hable con otras personas que tienen diabetes. Puede ser til hablar acerca de la angustia que siente. Encuentre maneras de Sales executive estrs que le resulten tiles. Por ejemplo, escuchar msica, arte, actividad fsica, meditacin y otros pasatiempos. Busque apoyo en lderes espirituales, familiares y amigos. Siga estas indicaciones en su casa: Haga su mejor esfuerzo por seguir su plan de control de la diabetes. Si le cuesta seguir su plan, hable con un educador en el cuidado de la diabetes o con otra persona que tenga diabetes. Ellos pueden tener ideas que le sirvan. Perdnese por no ser perfecto. A casi todo el mundo le cuesta cumplir con las tareas que implica la diabetes. Concurra a todas las visitas de seguimiento. El mdico querr monitorear sus niveles de [place]. Tambin puede comentar cualquier preocupacin que tenga en estas visitas. Dnde obtener apoyo Encuentre apoyo en la American Diabetes Association (ADA) (Asociacin Estadounidense de la Diabetes): diabetes.org Encuentre un especialista que lo ayude a Agricultural engineer. Haga una cita a travs de la Association  of Diabetes Care & Education Specialists (ADCES) (Asociacin de Especialistas en Atencin y Educacin sobre la Diabetes): diabeteseducator.org Comunquese con un mdico si: Cree que su diabetes se est saliendo de control. Le parece que est deprimido. Los medicamentos no ayudan a Chief Operating Officer su diabetes. Se siente abrumado. Solicite ayuda de inmediato si: Tiene pensamientos acerca de lastimarse o lastimar a Economist. Busque ayuda de inmediato si alguna vez siente que puede hacerse dao a usted mismo o a otros, o tiene pensamientos de Patent examiner a su vida. Dirjase al centro de urgencias ms cercano o: Llame al 911. Llame a National Suicide Prevention Lifeline (Lnea Telefnica Nacional para la Prevencin del Suicidio) al 714-428-9989 o al 988. Est disponible las 24 horas del da. Enve un mensaje de texto a la lnea para casos de crisis al [phone]. Esta informacin no tiene Theme park manager el consejo del mdico. Asegrese de hacerle al mdico cualquier pregunta que tenga. Document Revised: 06/07/2022 Document Reviewed: 06/07/2022 Elsevier Patient Education  2024 [employer].

## 2024-04-17 DIAGNOSIS — E1165 Type 2 diabetes mellitus with hyperglycemia: Secondary | ICD-10-CM | POA: Insufficient documentation

## 2024-04-17 DIAGNOSIS — E559 Vitamin D deficiency, unspecified: Secondary | ICD-10-CM | POA: Insufficient documentation

## 2024-04-19 LAB — CBC WITH DIFFERENTIAL/PLATELET
Basophils Absolute: 0 x10E3/uL (ref 0.0–0.2)
Basos: 0 %
EOS (ABSOLUTE): 0.2 x10E3/uL (ref 0.0–0.4)
Eos: 3 %
Hematocrit: 45.7 % (ref 37.5–51.0)
Hemoglobin: 15 g/dL (ref 13.0–17.7)
Immature Grans (Abs): 0 x10E3/uL (ref 0.0–0.1)
Immature Granulocytes: 0 %
Lymphocytes Absolute: 2.6 x10E3/uL (ref 0.7–3.1)
Lymphs: 36 %
MCH: 33.4 pg — ABNORMAL HIGH (ref 26.6–33.0)
MCHC: 32.8 g/dL (ref 31.5–35.7)
MCV: 102 fL — ABNORMAL HIGH (ref 79–97)
Monocytes Absolute: 0.6 x10E3/uL (ref 0.1–0.9)
Monocytes: 8 %
Neutrophils Absolute: 3.8 x10E3/uL (ref 1.4–7.0)
Neutrophils: 53 %
Platelets: 176 x10E3/uL (ref 150–450)
RBC: 4.49 x10E6/uL (ref 4.14–5.80)
RDW: 12.6 % (ref 11.6–15.4)
WBC: 7.2 x10E3/uL (ref 3.4–10.8)

## 2024-04-19 LAB — MICROALBUMIN / CREATININE URINE RATIO
Creatinine, Urine: 28.6 mg/dL
Microalb/Creat Ratio: <10 mg/g{creat} (ref 0–29)
Microalbumin, Urine: <3 ug/mL

## 2024-04-19 LAB — COMP. METABOLIC PANEL (12)
AST: 15 IU/L (ref 0–40)
Albumin: 4.3 g/dL (ref 4.1–5.1)
Alkaline Phosphatase: 183 IU/L — ABNORMAL HIGH (ref 44–121)
BUN/Creatinine Ratio: 15 (ref 9–20)
BUN: 12 mg/dL (ref 6–24)
Bilirubin Total: 0.5 mg/dL (ref 0.0–1.2)
Calcium: 9.1 mg/dL (ref 8.7–10.2)
Chloride: 99 mmol/L (ref 96–106)
Creatinine, Ser: 0.79 mg/dL (ref 0.76–1.27)
Globulin, Total: 2.5 g/dL (ref 1.5–4.5)
Glucose: 376 mg/dL — ABNORMAL HIGH (ref 70–99)
Potassium: 4.1 mmol/L (ref 3.5–5.2)
Sodium: 135 mmol/L (ref 134–144)
Total Protein: 6.8 g/dL (ref 6.0–8.5)
eGFR: 110 mL/min/1.73 (ref >59)

## 2024-04-19 LAB — TSH: TSH: 1.7 u[IU]/mL (ref 0.450–4.500)

## 2024-04-19 LAB — VITAMIN D 25 HYDROXY (VIT D DEFICIENCY, FRACTURES): Vit D, 25-Hydroxy: 14.7 ng/mL — ABNORMAL LOW (ref 30.0–100.0)

## 2024-04-19 LAB — PSA: Prostate Specific Ag, Serum: 1 ng/mL (ref 0.0–4.0)

## 2024-04-22 ENCOUNTER — Ambulatory Visit: Payer: Self-pay | Admitting: Physician Assistant

## 2024-04-22 DIAGNOSIS — E559 Vitamin D deficiency, unspecified: Secondary | ICD-10-CM

## 2024-04-22 MED ORDER — VITAMIN D (ERGOCALCIFEROL) 1.25 MG (50000 UNIT) PO CAPS: 50000.0000 [IU] | ORAL_CAPSULE | ORAL | 2 refills | Status: DC

## 2024-04-29 ENCOUNTER — Telehealth: Payer: Self-pay | Admitting: Physician Assistant

## 2024-04-29 NOTE — Telephone Encounter (Signed)
 Contacted pt to schedule follow up visit on [facility]U. Pt stated he will be working out of town and will come in when he gets back.

## 2024-09-10 ENCOUNTER — Emergency Department (HOSPITAL_COMMUNITY): Payer: Self-pay

## 2024-09-10 ENCOUNTER — Encounter: Payer: Self-pay | Admitting: Nurse Practitioner

## 2024-09-10 ENCOUNTER — Other Ambulatory Visit: Payer: Self-pay

## 2024-09-10 ENCOUNTER — Encounter (HOSPITAL_COMMUNITY): Payer: Self-pay

## 2024-09-10 ENCOUNTER — Ambulatory Visit
Admission: EM | Admit: 2024-09-10 | Discharge: 2024-09-10 | Disposition: A | Discharge status: Other Acute Inpatient Hospital | Payer: Self-pay

## 2024-09-10 ENCOUNTER — Emergency Department (HOSPITAL_COMMUNITY)
Admission: EM | Admit: 2024-09-10 | Discharge: 2024-09-11 | Disposition: A | Discharge status: Home / Self Care | Payer: Self-pay | Source: Ambulatory Visit | Attending: Emergency Medicine | Admitting: Emergency Medicine

## 2024-09-10 DIAGNOSIS — R519 Headache, unspecified: Secondary | ICD-10-CM

## 2024-09-10 DIAGNOSIS — R590 Localized enlarged lymph nodes: Secondary | ICD-10-CM | POA: Insufficient documentation

## 2024-09-10 DIAGNOSIS — R591 Generalized enlarged lymph nodes: Secondary | ICD-10-CM

## 2024-09-10 DIAGNOSIS — M545 Low back pain, unspecified: Secondary | ICD-10-CM | POA: Insufficient documentation

## 2024-09-10 DIAGNOSIS — R3589 Other polyuria: Secondary | ICD-10-CM | POA: Insufficient documentation

## 2024-09-10 DIAGNOSIS — R350 Frequency of micturition: Secondary | ICD-10-CM

## 2024-09-10 DIAGNOSIS — M549 Dorsalgia, unspecified: Secondary | ICD-10-CM

## 2024-09-10 DIAGNOSIS — E119 Type 2 diabetes mellitus without complications: Secondary | ICD-10-CM | POA: Insufficient documentation

## 2024-09-10 DIAGNOSIS — J349 Unspecified disorder of nose and nasal sinuses: Secondary | ICD-10-CM | POA: Insufficient documentation

## 2024-09-10 HISTORY — DX: Type 2 diabetes mellitus without complications: E11.9

## 2024-09-10 LAB — URINALYSIS, ROUTINE W REFLEX MICROSCOPIC
Bilirubin Urine: NEGATIVE
Glucose, UA: >=500 mg/dL — AB
Hgb urine dipstick: NEGATIVE
Ketones, ur: 5 mg/dL — AB
Leukocytes,Ua: NEGATIVE
Nitrite: NEGATIVE
Protein, ur: 30 mg/dL — AB
Specific Gravity, Urine: 1.035 — ABNORMAL HIGH (ref 1.005–1.030)
pH: 5 (ref 5.0–8.0)

## 2024-09-10 LAB — CBC
HCT: 45.5 % (ref 39.0–52.0)
Hemoglobin: 15.6 g/dL (ref 13.0–17.0)
MCH: 32.4 pg (ref 26.0–34.0)
MCHC: 34.3 g/dL (ref 30.0–36.0)
MCV: 94.4 fL (ref 80.0–100.0)
Platelets: 170 K/uL (ref 150–400)
RBC: 4.82 MIL/uL (ref 4.22–5.81)
RDW: 12.3 % (ref 11.5–15.5)
WBC: 8.6 K/uL (ref 4.0–10.5)
nRBC: 0 % (ref 0.0–0.2)

## 2024-09-10 LAB — BASIC METABOLIC PANEL WITH GFR
Anion gap: 9 (ref 5–15)
BUN: 21 mg/dL — ABNORMAL HIGH (ref 6–20)
CO2: 26 mmol/L (ref 22–32)
Calcium: 8.9 mg/dL (ref 8.9–10.3)
Chloride: 101 mmol/L (ref 98–111)
Creatinine, Ser: 0.84 mg/dL (ref 0.61–1.24)
GFR, Estimated: >60 mL/min
Glucose, Bld: 245 mg/dL — ABNORMAL HIGH (ref 70–99)
Potassium: 4.2 mmol/L (ref 3.5–5.1)
Sodium: 136 mmol/L (ref 135–145)

## 2024-09-10 LAB — CBC WITH DIFFERENTIAL/PLATELET
Abs Immature Granulocytes: 0.02 K/uL (ref 0.00–0.07)
Basophils Absolute: 0 K/uL (ref 0.0–0.1)
Basophils Relative: 1 %
Eosinophils Absolute: 0.2 K/uL (ref 0.0–0.5)
Eosinophils Relative: 3 %
HCT: 45.6 % (ref 39.0–52.0)
Hemoglobin: 15.8 g/dL (ref 13.0–17.0)
Immature Granulocytes: 0 %
Lymphocytes Relative: 48 %
Lymphs Abs: 4.2 K/uL — ABNORMAL HIGH (ref 0.7–4.0)
MCH: 32.7 pg (ref 26.0–34.0)
MCHC: 34.6 g/dL (ref 30.0–36.0)
MCV: 94.4 fL (ref 80.0–100.0)
Monocytes Absolute: 0.7 K/uL (ref 0.1–1.0)
Monocytes Relative: 8 %
Neutro Abs: 3.4 K/uL (ref 1.7–7.7)
Neutrophils Relative %: 40 %
Platelets: 175 K/uL (ref 150–400)
RBC: 4.83 MIL/uL (ref 4.22–5.81)
RDW: 12.3 % (ref 11.5–15.5)
WBC: 8.7 K/uL (ref 4.0–10.5)
nRBC: 0 % (ref 0.0–0.2)

## 2024-09-10 LAB — I-STAT VENOUS BLOOD GAS, ED
Acid-Base Excess: 1 mmol/L (ref 0.0–2.0)
Bicarbonate: 25.7 mmol/L (ref 20.0–28.0)
Calcium, Ion: 1.1 mmol/L — ABNORMAL LOW (ref 1.15–1.40)
HCT: 47 % (ref 39.0–52.0)
Hemoglobin: 16 g/dL (ref 13.0–17.0)
O2 Saturation: 69 %
Potassium: 3.9 mmol/L (ref 3.5–5.1)
Sodium: 137 mmol/L (ref 135–145)
TCO2: 27 mmol/L (ref 22–32)
pCO2, Ven: 41.4 mmHg — ABNORMAL LOW (ref 44–60)
pH, Ven: 7.401 (ref 7.25–7.43)
pO2, Ven: 36 mmHg (ref 32–45)

## 2024-09-10 LAB — BETA-HYDROXYBUTYRIC ACID: Beta-Hydroxybutyric Acid: 0.25 mmol/L (ref 0.05–0.27)

## 2024-09-10 NOTE — ED Notes (Signed)
 Patient is being discharged from the Urgent Care and sent to the Emergency Department via POV . Per AW, patient is in need of higher level of care due to back pain, urinary sx, lymph node in neck, HA x 3 months with dizziness. Patient is aware and verbalizes understanding of plan of care.  Vitals:   09/10/24 0930  BP: 129/70  Pulse: 64  Resp: 18  Temp: 97.9 F (36.6 C)  SpO2: 97%

## 2024-09-10 NOTE — ED Notes (Signed)
 Vbg result given to riley pa by at

## 2024-09-10 NOTE — Discharge Instructions (Signed)
 Based upon your multiple symptoms including a non improving headache, diffuse back pain, a lymph node in your neck, and urinary changes - I have recommended that you present to the Emergency Department for further evaluation.

## 2024-09-10 NOTE — ED Provider Triage Note (Signed)
 Emergency Medicine Provider Triage Evaluation Note  Derek Guzman , a 48 y.o. male  was evaluated in triage.  Pt complains of bump on right side of neck, headaches, and diffuse back pain has been going on for 3 months.  Was sent over from urgent care.  Denies any chest pain, shortness breath, abdominal pain, nausea, or vomiting.  Denies any blurry vision.  Denies any dizziness or lightheadedness.  Translator used during this encounter.  He denies any fevers.  Reports it is headache seems to get better with ibuprofen  but now ibuprofen  does not work as well.  Review of Systems  Positive:  Negative:   Physical Exam  BP 127/89   Pulse 62   Temp 98 F (36.7 C)   Resp 17   Ht 5' 6 (1.676 m)   Wt 99.8 kg   SpO2 97%   BMI 35.51 kg/m  Gen:   Awake, no distress   Resp:  Normal effort  MSK:   Moves extremities without difficulty  Other:  Moving all extremities.  No facial droop noted.  Does have a mobile small grape sized lymph node present in the right neck.  Nonpulsatile.  No overlying skin changes noted.  Medical Decision Making  Medically screening exam initiated at 4:05 PM.  Appropriate orders placed.  Derek Guzman was informed that the remainder of the evaluation will be completed by another provider, this initial triage assessment does not replace that evaluation, and the importance of remaining in the ED until their evaluation is complete.  Workup initiated in triage   Bernis Ernst, PA-C 09/10/24 1607

## 2024-09-10 NOTE — ED Triage Notes (Signed)
 Pt sts pain on right side of his neck; pt sts lower back pain x 3 months; unsure of injury

## 2024-09-10 NOTE — ED Triage Notes (Signed)
 Pt coming in complaining of a lump on his neck along with flank pain and urinary symptoms. Pt reports that his urine is too yellow.

## 2024-09-10 NOTE — ED Notes (Signed)
 Lab called for add on

## 2024-09-10 NOTE — ED Provider Notes (Signed)
 " [facility]    CSN: [id number] Arrival date & time: 09/10/24  0900      History   Chief Complaint Chief Complaint  Patient presents with   Back Pain   Neck Pain    HPI Derek Guzman is a 48 y.o. male.   Patient presents with a variety of symptoms to include: 1. Headache - onset approximately 3 months ago, no longer improving with OTC pain relievers.  Associated symptoms include dizziness, blurred vision. 2. Diffuse back pain - onset approximately 3 months ago.  No known injury.  Worsened in the lower thoracic and upper lumbar region.  Pain with all range of motion. 3.  Urinary changes and urgency - states increased nocturia, getting up approximately 3 times per night.  Feels like he might be incontinent at times due to the urgency.  This has changed over the past several weeks. 4. Lump in his neck - onset two weeks ago.  No recent URI or illness.    The history is provided by the patient. The history is limited by a language barrier.  Back Pain Associated symptoms: headaches   Associated symptoms: no abdominal pain, no chest pain, no dysuria and no fever   Neck Pain Associated symptoms: headaches   Associated symptoms: no chest pain and no fever     Past Medical History:  Diagnosis Date   Complication of anesthesia    Diabetes mellitus without complication (HCC)    PONV (postoperative nausea and vomiting)    one day after surgery had vomiting   Syncopal episodes     Patient Active Problem List   Diagnosis Date Noted   Type 2 diabetes mellitus with hyperglycemia, without long-term current use of insulin (HCC) 04/17/2024   Vitamin D  deficiency 04/17/2024   Compartment syndrome of lower extremity, traumatic 03/18/2014   Routine general medical examination at a health care facility 03/21/2013   Folliculitis 03/21/2013   Dental caries 03/21/2013   Health maintenance examination 03/21/2013    Past Surgical History:  Procedure Laterality Date    APPLICATION OF WOUND VAC Right 03/18/2014   Procedure: APPLICATION OF WOUND VAC;  Surgeon: Jerona Harden GAILS, MD;  Location: [facility] OR;  Service: Orthopedics;  Laterality: Right;   dental fillings     FASCIOTOMY Right 03/18/2014   Procedure: FASCIOTOMY;  Surgeon: Jerona Harden GAILS, MD;  Location: [facility] OR;  Service: Orthopedics;  Laterality: Right;   I & D EXTREMITY Right 04/18/2014   Procedure: IRRIGATION AND DEBRIDEMENT EXTREMITY;  Surgeon: Jerona Harden GAILS, MD;  Location: [facility] OR;  Service: Orthopedics;  Laterality: Right;  Right Ankle Manipulation Under Anesthesia, Apply Theraskin Graft   ORIF ANKLE FRACTURE Right 03/18/2014   Procedure: OPEN REDUCTION INTERNAL FIXATION (ORIF) ANKLE FRACTURE;  Surgeon: Jerona Harden GAILS, MD;  Location: [facility] OR;  Service: Orthopedics;  Laterality: Right;       Home Medications    Prior to Admission medications  Medication Sig Start Date End Date Taking? Authorizing Provider  cyclobenzaprine  (FLEXERIL ) 10 MG tablet Take 1 tablet (10 mg total) by mouth 3 (three) times daily as needed for muscle spasms. 04/16/24   Mayers, Cari S, PA-C  glucose blood (TRUE METRIX BLOOD GLUCOSE TEST) test strip 1 each by Other route 2 (two) times daily. Use as instructed 04/16/24   Mayers, Cari S, PA-C  ibuprofen  (ADVIL ) 600 MG tablet Take 1 tablet (600 mg total) by mouth every 8 (eight) hours as needed. 04/16/24   Mayers, Cari S, PA-C  metFORMIN  (  GLUCOPHAGE -XR) 500 MG 24 hr tablet Take 1 tab PO with breakfast for one week then take BID 04/16/24   Mayers, Cari S, PA-C  TRUEplus Lancets 28G MISC 1 each by Does not apply route 2 (two) times daily at 8 am and 10 pm. 04/16/24   Mayers, Cari S, PA-C  Vitamin D , Ergocalciferol , (DRISDOL ) 1.25 MG (50000 UNIT) CAPS capsule Take 1 capsule (50,000 Units total) by mouth every 7 (seven) days. 04/22/24   Mayers, Kirk RAMAN, PA-C    Family History History reviewed. No pertinent family history.  Social History Social History[1]   Allergies   Patient has no known  allergies.   Review of Systems Review of Systems  Constitutional:  Negative for fatigue and fever.  HENT:  Negative for congestion, rhinorrhea and sore throat.   Eyes:  Positive for visual disturbance. Negative for pain and redness.  Respiratory:  Negative for cough and shortness of breath.   Cardiovascular:  Negative for chest pain.  Gastrointestinal:  Negative for abdominal pain, constipation, diarrhea and vomiting.  Endocrine: Positive for polyuria.  Genitourinary:  Positive for frequency and urgency. Negative for dysuria.  Musculoskeletal:  Positive for back pain and neck pain.  Skin:  Negative for rash.  Neurological:  Positive for dizziness and headaches.  Psychiatric/Behavioral:  Positive for sleep disturbance.      Physical Exam Triage Vital Signs ED Triage Vitals [09/10/24 0930]  Encounter Vitals Group     BP 129/70     Girls Systolic BP Percentile      Girls Diastolic BP Percentile      Boys Systolic BP Percentile      Boys Diastolic BP Percentile      Pulse Rate 64     Resp 18     Temp 97.9 F (36.6 C)     Temp Source Oral     SpO2 97 %     Weight      Height      Head Circumference      Peak Flow      Pain Score      Pain Loc      Pain Education      Exclude from Growth Chart    No data found.  Updated Vital Signs BP 129/70 (BP Location: Left Arm)   Pulse 64   Temp 97.9 F (36.6 C) (Oral)   Resp 18   SpO2 97%    Physical Exam Vitals and nursing note reviewed.  Constitutional:      Appearance: Normal appearance.  HENT:     Head: Normocephalic.  Neck:     Comments: Enlarged fixed non tender proximal posterior cervical lymph node. Cardiovascular:     Rate and Rhythm: Normal rate and regular rhythm.     Heart sounds: No murmur heard. Pulmonary:     Effort: Pulmonary effort is normal.     Breath sounds: Normal breath sounds. No wheezing, rhonchi or rales.  Abdominal:     General: Bowel sounds are normal.  Musculoskeletal:     Comments:  Diffuse cervical, thoracic, lumbar, and sacral bony spinal and bilateral paraspinal muscle tenderness to palpation.  Discomfort with all range of motion including flexion, extension, and rotation of his torso.  Lymphadenopathy:     Head:     Right side of head: No submental, submandibular, tonsillar, preauricular or posterior auricular adenopathy.     Left side of head: No submental, submandibular, tonsillar, preauricular, posterior auricular or occipital adenopathy.     Cervical:  Cervical adenopathy present.     Upper Body:     Right upper body: No supraclavicular adenopathy.     Left upper body: No supraclavicular adenopathy.  Skin:    General: Skin is warm and dry.  Neurological:     General: No focal deficit present.     Mental Status: He is alert and oriented to person, place, and time.     Comments: Diminished patellar reflexes bilaterally.  Psychiatric:        Mood and Affect: Mood normal.        Behavior: Behavior normal.        Thought Content: Thought content normal.        Judgment: Judgment normal.      UC Treatments / Results  Labs (all labs ordered are listed, but only abnormal results are displayed) Labs Reviewed - No data to display  EKG   Radiology No results found.  Procedures Procedures (including critical care time)  Medications Ordered in UC Medications - No data to display  Initial Impression / Assessment and Plan / UC Course  I have reviewed the triage vital signs and the nursing notes.  Pertinent labs & imaging results that were available during my care of the patient were reviewed by me and considered in my medical decision making (see chart for details).    Patient presented with multiple concerns to include ongoing headache, diffuse spinal pain, increased urinary urgency/frequency, dizziness, blurred vision, and a lymph node to the right side of his neck.  He is a known Type 2 diabetic.  He has no PCP oversight or management.  Based upon his  symptomology, I do feel he will require a higher level work up and evaluation than what is available in Urgent Care.  Through shared decision making, he is agreeable to present at the Emergency Department for ongoing care.  He is stable for transport via private vehicle.  Final Clinical Impressions(s) / UC Diagnoses   Final diagnoses:  Diffuse Back Pain  Acute nonintractable headache, unspecified headache type  Urinary frequency  Lymphadenopathy     Discharge Instructions      Based upon your multiple symptoms including a non improving headache, diffuse back pain, a lymph node in your neck, and urinary changes - I have recommended that you present to the Emergency Department for further evaluation.   ED Prescriptions   None    PDMP not reviewed this encounter.    [1]  Social History Tobacco Use   Smoking status: Never   Smokeless tobacco: Never  Vaping Use   Vaping status: Never Used  Substance Use Topics   Alcohol use: Yes    Comment: just on his birthday   Drug use: No     Janet Therisa PARAS, FNP 09/10/24 [phone]  "

## 2024-09-11 MED ORDER — CYCLOBENZAPRINE HCL 10 MG PO TABS: 10.0000 mg | ORAL_TABLET | Freq: Three times a day (TID) | ORAL | 0 refills | Status: AC | PRN

## 2024-09-11 MED ORDER — AMOXICILLIN-POT CLAVULANATE 875-125 MG PO TABS: 1.0000 | ORAL_TABLET | Freq: Two times a day (BID) | ORAL | 0 refills | Status: DC

## 2024-09-11 NOTE — ED Notes (Signed)
 Patient w/ complaints of lower back pain 9/10. No known injury. Patient is worried about his kidney function.

## 2024-09-11 NOTE — ED Provider Notes (Signed)
 " [facility] Emergency Department Provider Note MRN:  [id number]  Arrival date & time: 09/11/2024     Chief Complaint   Flank Pain   History of Present Illness   Derek Guzman is a 48 y.o. year-old male presents to the ED with chief complaint of cold complaints.  1.  Headache and swollen lymph nodes.  States that he has symptoms for a couple of months.  Denies any successful treatments PTA.  Denies fever or chills.  2. Low back pain.  Denies any injuries.  Denies dysuria.  Worse with movement and palpation.  3. Polyuria.  Hx of DM2.  States that he doesn't have a doctor.  Denies painful urination.  History provided by patient. Professional Spanish interpreter used through [employer].  Review of Systems  Pertinent positive and negative review of systems noted in HPI.    Physical Exam   Vitals:   09/10/24 2246 09/10/24 2358  BP: 138/85 129/69  Pulse: 70 72  Resp: 18 18  Temp: 98.3 F (36.8 C) 98.1 F (36.7 C)  SpO2: 96% 98%    CONSTITUTIONAL:  non toxic-appearing, NAD NEURO:  Alert and oriented x 3, CN 3-12 grossly intact EYES:  eyes equal and reactive ENT/NECK:  Supple, no stridor  CARDIO:  normal rate, regular rhythm, appears well-perfused  PULM:  No respiratory distress, CTAB GI/GU:  non-distended,  MSK/SPINE:  No gross deformities, no edema, moves all extremities  SKIN:  no rash, atraumatic   *Additional and/or pertinent findings included in MDM below  Diagnostic and Interventional Summary    EKG Interpretation Date/Time:    Ventricular Rate:    PR Interval:    QRS Duration:    QT Interval:    QTC Calculation:   R Axis:      Text Interpretation:         Labs Reviewed  URINALYSIS, ROUTINE W REFLEX MICROSCOPIC - Abnormal; Notable for the following components:      Result Value   Color, Urine AMBER (*)    APPearance HAZY (*)    Specific Gravity, Urine 1.035 (*)    Glucose, UA >=500 (*)    Ketones, ur 5 (*)     Protein, ur 30 (*)    Bacteria, UA FEW (*)    All other components within normal limits  BASIC METABOLIC PANEL WITH GFR - Abnormal; Notable for the following components:   Glucose, Bld 245 (*)    BUN 21 (*)    All other components within normal limits  CBC WITH DIFFERENTIAL/PLATELET - Abnormal; Notable for the following components:   Lymphs Abs 4.2 (*)    All other components within normal limits  I-STAT VENOUS BLOOD GAS, ED - Abnormal; Notable for the following components:   pCO2, Ven 41.4 (*)    Calcium, Ion 1.10 (*)    All other components within normal limits  CBC  BETA-HYDROXYBUTYRIC ACID    CT Head Wo Contrast  Final Result      Medications - No data to display   Procedures  /  Critical Care Procedures  ED Course and Medical Decision Making  I have reviewed the triage vital signs, the nursing notes, and pertinent available records from the EMR.  Social Determinants Affecting Complexity of Care: Patient has no clinically significant social determinants affecting this chief complaint..   ED Course:    Medical Decision Making Headaches and swollen lymph nodes thought secondary to his sinus disease.  Will trial treatment with Augmentin .  CT head otherwise unremarkable.  Anterior cervical lymph nodes are palpable.  No obvious erythema or abscess.  Recommend recheck with PCP following treatment.  Polyuria, likely secondary to poorly managed diabetes.  Will have him follow-up with PCP for better blood sugar control.  Low back pain, seems musculoskeletal, worse with movement and bending and palpation.  No obvious trauma.  Will trial muscle relaxer and have him follow-up with PCP.  Amount and/or Complexity of Data Reviewed Labs: ordered.  Risk Prescription drug management.         Consultants: No consultations were needed in caring for this patient.   Treatment and Plan: I considered admission due to patient's initial presentation, but after considering  the examination and diagnostic results, patient will not require admission and can be discharged with outpatient follow-up.    Final Clinical Impressions(s) / ED Diagnoses     ICD-10-CM   1. Sinus disease  J34.9     2. Polyuria  R35.89     3. Midline low back pain without sciatica, unspecified chronicity  M54.50     4. Acute midline low back pain without sciatica  M54.50 cyclobenzaprine  (FLEXERIL ) 10 MG tablet      ED Discharge Orders          Ordered    cyclobenzaprine  (FLEXERIL ) 10 MG tablet  3 times daily PRN        09/11/24 0148    amoxicillin -clavulanate (AUGMENTIN ) 875-125 MG tablet  Every 12 hours        09/11/24 0148              Discharge Instructions Discussed with and Provided to Patient:   Discharge Instructions   None      Vicky Charleston, PA-C 09/11/24 0249    [name], MD 09/11/24 [phone]  "

## 2024-09-11 NOTE — ED Notes (Signed)
..  Patient is A&Ox4 upon discharge. Patient verbalized understanding of prescriptions, discharge instructions and follow-up care. Patient ambulatory from ED with steady gait.

## 2024-10-03 ENCOUNTER — Encounter (HOSPITAL_BASED_OUTPATIENT_CLINIC_OR_DEPARTMENT_OTHER): Payer: Self-pay

## 2024-10-03 ENCOUNTER — Ambulatory Visit (INDEPENDENT_AMBULATORY_CARE_PROVIDER_SITE_OTHER): Payer: Self-pay

## 2024-10-03 ENCOUNTER — Ambulatory Visit (HOSPITAL_BASED_OUTPATIENT_CLINIC_OR_DEPARTMENT_OTHER)
Admission: RE | Admit: 2024-10-03 | Discharge: 2024-10-03 | Disposition: A | Discharge status: Home / Self Care | Payer: Self-pay | Source: Ambulatory Visit

## 2024-10-03 VITALS — BP 124/84 | HR 63 | Resp 18 | Ht 66.0 in | Wt 210.0 lb

## 2024-10-03 DIAGNOSIS — R591 Generalized enlarged lymph nodes: Secondary | ICD-10-CM

## 2024-10-03 DIAGNOSIS — R221 Localized swelling, mass and lump, neck: Secondary | ICD-10-CM

## 2024-10-03 DIAGNOSIS — E1165 Type 2 diabetes mellitus with hyperglycemia: Secondary | ICD-10-CM

## 2024-10-03 DIAGNOSIS — Z7689 Persons encountering health services in other specified circumstances: Secondary | ICD-10-CM

## 2024-10-03 MED ORDER — METFORMIN HCL ER 500 MG PO TB24: ORAL_TABLET | ORAL | 1 refills | Status: DC

## 2024-10-03 NOTE — Progress Notes (Signed)
 "   New Patient Office Visit  Subjective:   Derek Guzman 1976/04/14 10/03/2024  Chief Complaint  Patient presents with   Annual Exam   Cyst    Right side of neck. 3 mths ago   Diabetes    Due to language barrier, a medical interpreter was present during the HPI, ROS, and discussion for the plan of care.  Interpreter: [place]  HPI: Sabas Frett presents today to establish care at [facility] at [facility]. Introduced to publishing rights manager role and practice setting with verbalized understanding by patient.  All questions answered.   Last PCP: 2015 Last annual physical: 2015 Concerns: See below   DIABETES MELLITUS: Corderro Koloski presents for the medical management of diabetes.  Current diabetes medication regimen: Metformin  500mg  daily Patient is adhering to a diabetic diet.  Patient is exercising regularly.  Patient is checking BS regularly. Unsure what levels are. Patient is checking their feet regularly.  Denies polydipsia, polyphagia, polyuria, open wounds or ulcers on feet.  Lab Results  Component Value Date   HGBA1C 10.3 (A) 04/16/2024    CYST ON NECK: States he has had a cyst on the right side of his neck approximately 3 months ago. Denies recent sickness when this cyst started. States he has lost about 10 pounds in the past 3 months. States increased fatigue. States only family cancer he knows about is his grandmother has breast cancer.    The following portions of the patient's history were reviewed and updated as appropriate: past medical history, past surgical history, family history, social history, allergies, medications, and problem list.   Patient Active Problem List   Diagnosis Date Noted   Type 2 diabetes mellitus with hyperglycemia, without long-term current use of insulin (HCC) 04/17/2024   Vitamin D  deficiency 04/17/2024   Compartment syndrome of lower extremity, traumatic 03/18/2014   Routine  general medical examination at a health care facility 03/21/2013   Folliculitis 03/21/2013   Dental caries 03/21/2013   Health maintenance examination 03/21/2013   Past Medical History:  Diagnosis Date   Complication of anesthesia    Diabetes mellitus without complication (HCC)    PONV (postoperative nausea and vomiting)    one day after surgery had vomiting   Syncopal episodes    Past Surgical History:  Procedure Laterality Date   APPLICATION OF WOUND VAC Right 03/18/2014   Procedure: APPLICATION OF WOUND VAC;  Surgeon: Jerona Harden GAILS, MD;  Location: [facility] OR;  Service: Orthopedics;  Laterality: Right;   dental fillings     FASCIOTOMY Right 03/18/2014   Procedure: FASCIOTOMY;  Surgeon: Jerona Harden GAILS, MD;  Location: [facility] OR;  Service: Orthopedics;  Laterality: Right;   I & D EXTREMITY Right 04/18/2014   Procedure: IRRIGATION AND DEBRIDEMENT EXTREMITY;  Surgeon: Jerona Harden GAILS, MD;  Location: [facility] OR;  Service: Orthopedics;  Laterality: Right;  Right Ankle Manipulation Under Anesthesia, Apply Theraskin Graft   ORIF ANKLE FRACTURE Right 03/18/2014   Procedure: OPEN REDUCTION INTERNAL FIXATION (ORIF) ANKLE FRACTURE;  Surgeon: Jerona Harden GAILS, MD;  Location: [facility] OR;  Service: Orthopedics;  Laterality: Right;   History reviewed. No pertinent family history. Social History   Socioeconomic History   Marital status: Married    Spouse name: Not on file   Number of children: 2   Years of education: 11   Highest education level: Not on file  Occupational History   Occupation: concrete   Tobacco Use   Smoking status: Never  Smokeless tobacco: Never  Vaping Use   Vaping status: Never Used  Substance and Sexual Activity   Alcohol use: Yes    Comment: just on his birthday   Drug use: No   Sexual activity: Not on file  Other Topics Concern   Not on file  Social History Narrative   Not on file   Social Drivers of Health   Tobacco Use: Low Risk (10/03/2024)   Patient History    Smoking Tobacco Use:  Never    Smokeless Tobacco Use: Never    Passive Exposure: Not on file  Financial Resource Strain: Not on file  Food Insecurity: Not on file  Transportation Needs: Not on file  Physical Activity: Not on file  Stress: Not on file  Social Connections: Not on file  Intimate Partner Violence: Not on file  Depression (PHQ2-9): Medium Risk (04/16/2024)   Depression (PHQ2-9)    PHQ-2 Score: 6  Alcohol Screen: Not on file  Housing: Not on file  Utilities: Not on file  Health Literacy: Not on file   Outpatient Medications Prior to Visit  Medication Sig Dispense Refill   cyclobenzaprine  (FLEXERIL ) 10 MG tablet Take 1 tablet (10 mg total) by mouth 3 (three) times daily as needed for muscle spasms. 30 tablet 0   glucose blood (TRUE METRIX BLOOD GLUCOSE TEST) test strip 1 each by Other route 2 (two) times daily. Use as instructed 100 each 12   ibuprofen  (ADVIL ) 600 MG tablet Take 1 tablet (600 mg total) by mouth every 8 (eight) hours as needed. 60 tablet 0   TRUEplus Lancets 28G MISC 1 each by Does not apply route 2 (two) times daily at 8 am and 10 pm. 100 each 11   metFORMIN  (GLUCOPHAGE -XR) 500 MG 24 hr tablet Take 1 tab PO with breakfast for one week then take BID 60 tablet 1   amoxicillin -clavulanate (AUGMENTIN ) 875-125 MG tablet Take 1 tablet by mouth every 12 (twelve) hours. (Patient not taking: Reported on 10/03/2024) 14 tablet 0   Vitamin D , Ergocalciferol , (DRISDOL ) 1.25 MG (50000 UNIT) CAPS capsule Take 1 capsule (50,000 Units total) by mouth every 7 (seven) days. (Patient not taking: Reported on 10/03/2024) 4 capsule 2   No facility-administered medications prior to visit.   Allergies[1]  ROS: A complete ROS was performed with pertinent positives/negatives noted in the HPI. The remainder of the ROS are negative.   Objective:   Today's Vitals   10/03/24 0805 10/03/24 0830  BP: (!) 146/90 124/84  Pulse: 63   Resp: 18   SpO2: 98%   Weight: 210 lb (95.3 kg)   Height: 5' 6 (1.676 m)    PainSc: 0-No pain     GENERAL: Well-appearing, in NAD. Well nourished. NECK: Trachea midline. Full ROM w/o pain or tenderness. Right sided lymphadenopathy with tenderness noted to area.  RESPIRATORY: Chest wall symmetrical. Respirations even and non-labored. Breath sounds clear to auscultation bilaterally.  CARDIAC: S1, S2 present, regular rate and rhythm without murmur or gallops. Peripheral pulses 2+ bilaterally.  MSK: Muscle tone and strength appropriate for age. Joints w/o tenderness, redness, or swelling.  EXTREMITIES: Without clubbing, cyanosis, or edema.  NEUROLOGIC: No motor or sensory deficits. Steady, even gait. C2-C12 intact.  PSYCH/MENTAL STATUS: Alert, oriented x 3. Cooperative, appropriate mood and affect.    Health Maintenance Due  Topic Date Due   FOOT EXAM  Never done   OPHTHALMOLOGY EXAM  Never done   HIV Screening  Never done   Hepatitis C Screening  Never done   DTaP/Tdap/Td (1 - Tdap) Never done   Pneumococcal Vaccine (1 of 2 - PCV) Never done   Hepatitis B Vaccines 19-59 Average Risk (1 of 3 - 19+ 3-dose series) Never done   Colonoscopy  Never done   Influenza Vaccine  04/12/2024   COVID-19 Vaccine (2 - 2025-26 season) 05/13/2024    No results found for any visits on 10/03/24.     Assessment & Plan:  1. Encounter to establish care with new doctor (Primary) Discussed role of NP and expectations of the Primary Care Clinic. Discussed medical, surgical, and family history.   2. Type 2 diabetes mellitus with hyperglycemia, without long-term current use of insulin (HCC) Discussed repeating lab work at today's visit to assess A1C and kidney function. Also discussed with that patient that depending on his results will depend on if medication changes need to be made in which both he and his wife verbalized understanding. Discussed red flag symptoms and when to be seen by PCP or ER.  - metFORMIN  (GLUCOPHAGE -XR) 500 MG 24 hr tablet; Take 1 tab PO with breakfast for  one week then take BID  Dispense: 60 tablet; Refill: 1 - Hemoglobin A1c - Comprehensive metabolic panel with GFR  3. Nodule of neck 4. Lymphadenopathy Discussed ordering a STAT US  of patients neck to rule out cancer. I discussed this can be seen in the setting of sickness however with area continuing to grow and remain after 3 months I would like imaging done. I also am ordering lab work today to check blood counts. Pt agreeable to plan. - CBC with Differential/Platelet - US  Soft Tissue Head/Neck (NON-THYROID )  Patient to reach out to office if new, worrisome, or unresolved symptoms arise or if no improvement in patient's condition. Patient verbalized understanding and is agreeable to treatment plan. All questions answered to patient's satisfaction.    Return in about 4 weeks (around 10/31/2024) for annual physical (rest of labs day of).    Lauraine Almarie Angus DNP, FNP-C     [1] No Known Allergies  "

## 2024-10-04 ENCOUNTER — Ambulatory Visit (HOSPITAL_BASED_OUTPATIENT_CLINIC_OR_DEPARTMENT_OTHER): Payer: Self-pay

## 2024-10-04 DIAGNOSIS — R221 Localized swelling, mass and lump, neck: Secondary | ICD-10-CM

## 2024-10-04 DIAGNOSIS — E1165 Type 2 diabetes mellitus with hyperglycemia: Secondary | ICD-10-CM

## 2024-10-04 DIAGNOSIS — R591 Generalized enlarged lymph nodes: Secondary | ICD-10-CM

## 2024-10-04 LAB — COMPREHENSIVE METABOLIC PANEL WITH GFR
ALT: 26 IU/L (ref 0–44)
AST: 18 IU/L (ref 0–40)
Albumin: 4.3 g/dL (ref 4.1–5.1)
Alkaline Phosphatase: 133 IU/L — ABNORMAL HIGH (ref 47–123)
BUN/Creatinine Ratio: 21 — ABNORMAL HIGH (ref 9–20)
BUN: 15 mg/dL (ref 6–24)
Bilirubin Total: 1.1 mg/dL (ref 0.0–1.2)
CO2: 21 mmol/L (ref 20–29)
Calcium: 8.8 mg/dL (ref 8.7–10.2)
Chloride: 102 mmol/L (ref 96–106)
Creatinine, Ser: 0.73 mg/dL — ABNORMAL LOW (ref 0.76–1.27)
Globulin, Total: 3 g/dL (ref 1.5–4.5)
Glucose: 235 mg/dL — ABNORMAL HIGH (ref 70–99)
Potassium: 4.6 mmol/L (ref 3.5–5.2)
Sodium: 138 mmol/L (ref 134–144)
Total Protein: 7.3 g/dL (ref 6.0–8.5)
eGFR: 112 mL/min/1.73

## 2024-10-04 LAB — CBC WITH DIFFERENTIAL/PLATELET
Basophils Absolute: 0 x10E3/uL (ref 0.0–0.2)
Basos: 1 %
EOS (ABSOLUTE): 0.2 x10E3/uL (ref 0.0–0.4)
Eos: 4 %
Hematocrit: 45.8 % (ref 37.5–51.0)
Hemoglobin: 15.3 g/dL (ref 13.0–17.7)
Immature Grans (Abs): 0 x10E3/uL (ref 0.0–0.1)
Immature Granulocytes: 0 %
Lymphocytes Absolute: 2.9 x10E3/uL (ref 0.7–3.1)
Lymphs: 45 %
MCH: 32.9 pg (ref 26.6–33.0)
MCHC: 33.4 g/dL (ref 31.5–35.7)
MCV: 99 fL — ABNORMAL HIGH (ref 79–97)
Monocytes Absolute: 0.5 x10E3/uL (ref 0.1–0.9)
Monocytes: 9 %
Neutrophils Absolute: 2.6 x10E3/uL (ref 1.4–7.0)
Neutrophils: 41 %
Platelets: 174 x10E3/uL (ref 150–450)
RBC: 4.65 x10E6/uL (ref 4.14–5.80)
RDW: 12.8 % (ref 11.6–15.4)
WBC: 6.3 x10E3/uL (ref 3.4–10.8)

## 2024-10-04 LAB — HEMOGLOBIN A1C
Est. average glucose Bld gHb Est-mCnc: 275 mg/dL
Hgb A1c MFr Bld: 11.2 % — ABNORMAL HIGH (ref 4.8–5.6)

## 2024-10-04 MED ORDER — METFORMIN HCL 1000 MG PO TABS: 1000.0000 mg | ORAL_TABLET | Freq: Two times a day (BID) | ORAL | 3 refills | Status: AC

## 2024-10-04 NOTE — Progress Notes (Signed)
 Please call patient and inform him that the US  neck showed an enlarged lymph node which we talked about in the office. However they recommended a CT with contrast of his neck. I have placed the order as stat and it will be done here at [facility].

## 2024-10-04 NOTE — Telephone Encounter (Signed)
 Please call patient with interpreter and inform him:   Overall his blood counts, electrolytes, and liver functions are normal.   His kidney function is slightly lower than normal which could be due to his uncontrolled diabetes. His A1C is higher than last time. I am placing an order for a new Metformin  dose. I need him to take this 1000 mg pill at breakfast and at dinner.  He needs to take this medication exactly and do not change the dose himself. I need to have a better idea of how this medication will control his current T2DM and am unable to have an accurate result if he is changing his doses at home.    I will see him in 4 weeks for his physical where we will discuss further.    Please call patient and inform him that the US  neck showed an enlarged lymph node which we talked about in the office. However they recommended a CT with contrast of his neck. I have placed the order as stat and it will be done here at [facility].

## 2024-10-04 NOTE — Progress Notes (Signed)
 Please call patient with interpreter and inform him:  Overall his blood counts, electrolytes, and liver functions are normal.  His kidney function is slightly lower than normal which could be due to his uncontrolled diabetes. His A1C is higher than last time. I am placing an order for a new Metformin  dose. I need him to take this 1000 mg pill at breakfast and at dinner.  He needs to take this medication exactly and do not change the dose himself. I need to have a better idea of how this medication will control his current T2DM and am unable to have an accurate result if he is changing his doses at home.   I will see him in 4 weeks for his physical where we will discuss further.

## 2024-10-04 NOTE — Telephone Encounter (Signed)
-----   Message from Lauraine FORBES Angus sent at 10/04/2024  8:34 AM EST ----- Please call patient with interpreter and inform him:  Overall his blood counts, electrolytes, and liver functions are normal.  His kidney function is slightly lower than normal which could be due to his uncontrolled diabetes. His A1C is higher than last time. I am placing an order for a new Metformin  dose. I need him to take this 1000 mg pill at breakfast and at dinner.  He needs to take this medication exactly and do not  change the dose himself. I need to have a better idea of how this medication will control his current T2DM and am unable to have an accurate result if he is changing his doses at home.   I will see him in 4 weeks for his physical where we will discuss further.

## 2024-10-09 ENCOUNTER — Ambulatory Visit (HOSPITAL_BASED_OUTPATIENT_CLINIC_OR_DEPARTMENT_OTHER)
Admission: RE | Admit: 2024-10-09 | Discharge: 2024-10-09 | Disposition: A | Discharge status: Home / Self Care | Payer: Self-pay | Source: Ambulatory Visit

## 2024-10-09 DIAGNOSIS — R221 Localized swelling, mass and lump, neck: Secondary | ICD-10-CM | POA: Insufficient documentation

## 2024-10-09 MED ORDER — IOHEXOL 300 MG/ML  SOLN
100.0000 mL | Freq: Once | INTRAMUSCULAR | Status: AC | PRN
  Administered 2024-10-09: 75 mL via INTRAVENOUS

## 2024-10-10 ENCOUNTER — Encounter (INDEPENDENT_AMBULATORY_CARE_PROVIDER_SITE_OTHER): Payer: Self-pay

## 2024-10-10 NOTE — Addendum Note (Signed)
 Addended by: Jayonna Meyering ELIZABETH on: 10/10/2024 08:18 AM   Modules accepted: Orders

## 2024-10-31 ENCOUNTER — Encounter (HOSPITAL_BASED_OUTPATIENT_CLINIC_OR_DEPARTMENT_OTHER): Payer: Self-pay

## 2024-11-14 ENCOUNTER — Institutional Professional Consult (permissible substitution) (INDEPENDENT_AMBULATORY_CARE_PROVIDER_SITE_OTHER): Payer: Self-pay | Admitting: Otolaryngology
